# Patient Record
Sex: Female | Born: 1970 | Race: Black or African American | Hispanic: No | Marital: Married | Smoking: Current every day smoker
Health system: Southern US, Community
[De-identification: ages and names within clinical notes are randomized; demographics above are authoritative.]

## PROBLEM LIST (undated history)

## (undated) DIAGNOSIS — F32A Depression, unspecified: Secondary | ICD-10-CM

## (undated) DIAGNOSIS — IMO0002 Reserved for concepts with insufficient information to code with codable children: Secondary | ICD-10-CM

## (undated) DIAGNOSIS — Z8619 Personal history of other infectious and parasitic diseases: Secondary | ICD-10-CM

## (undated) DIAGNOSIS — M1990 Unspecified osteoarthritis, unspecified site: Secondary | ICD-10-CM

## (undated) DIAGNOSIS — T7840XA Allergy, unspecified, initial encounter: Secondary | ICD-10-CM

## (undated) DIAGNOSIS — F329 Major depressive disorder, single episode, unspecified: Secondary | ICD-10-CM

## (undated) DIAGNOSIS — Z5189 Encounter for other specified aftercare: Secondary | ICD-10-CM

## (undated) DIAGNOSIS — D649 Anemia, unspecified: Secondary | ICD-10-CM

## (undated) DIAGNOSIS — I10 Essential (primary) hypertension: Secondary | ICD-10-CM

## (undated) HISTORY — DX: Major depressive disorder, single episode, unspecified: F32.9

## (undated) HISTORY — DX: Unspecified osteoarthritis, unspecified site: M19.90

## (undated) HISTORY — DX: Anemia, unspecified: D64.9

## (undated) HISTORY — DX: Personal history of other infectious and parasitic diseases: Z86.19

## (undated) HISTORY — DX: Reserved for concepts with insufficient information to code with codable children: IMO0002

## (undated) HISTORY — DX: Encounter for other specified aftercare: Z51.89

## (undated) HISTORY — PX: TONSILLECTOMY: SUR1361

## (undated) HISTORY — DX: Depression, unspecified: F32.A

## (undated) HISTORY — DX: Allergy, unspecified, initial encounter: T78.40XA

## (undated) HISTORY — DX: Essential (primary) hypertension: I10

## (undated) HISTORY — PX: WISDOM TOOTH EXTRACTION: SHX21

---

## 2006-02-02 ENCOUNTER — Other Ambulatory Visit: Admission: RE | Admit: 2006-02-02 | Discharge: 2006-02-02 | Payer: Self-pay | Admitting: Obstetrics and Gynecology

## 2006-08-02 ENCOUNTER — Ambulatory Visit: Payer: Self-pay | Admitting: Family Medicine

## 2006-08-02 LAB — CONVERTED CEMR LAB
Chloride: 107 meq/L (ref 96–112)
Chol/HDL Ratio, serum: 3.3
Creatinine, Ser: 0.8 mg/dL (ref 0.4–1.2)
Eosinophil percent: 1 % (ref 0.0–5.0)
GFR calc non Af Amer: 87 mL/min
Glucose, Bld: 75 mg/dL (ref 70–99)
HCT: 33 % — ABNORMAL LOW (ref 36.0–46.0)
HDL: 46.8 mg/dL (ref >39.0)
LDL Cholesterol: 92 mg/dL (ref 0–99)
MCHC: 32 g/dL (ref 30.0–36.0)
MCV: 72.7 fL — ABNORMAL LOW (ref 78.0–100.0)
Monocytes Absolute: 0.1 10*3/uL — ABNORMAL LOW (ref 0.2–0.7)
Neutro Abs: 4 10*3/uL (ref 1.4–7.7)
Platelets: 343 10*3/uL (ref 150–400)
RBC: 4.54 M/uL (ref 3.87–5.11)
Sodium: 137 meq/L (ref 135–145)
Triglyceride fasting, serum: 72 mg/dL (ref 0–149)
VLDL: 14 mg/dL (ref 0–40)

## 2006-08-23 ENCOUNTER — Ambulatory Visit: Payer: Self-pay | Admitting: Family Medicine

## 2006-08-23 ENCOUNTER — Encounter: Admission: RE | Admit: 2006-08-23 | Discharge: 2006-08-23 | Payer: Self-pay | Admitting: Family Medicine

## 2006-08-24 ENCOUNTER — Ambulatory Visit: Payer: Self-pay | Admitting: Family Medicine

## 2006-10-19 DIAGNOSIS — IMO0001 Reserved for inherently not codable concepts without codable children: Secondary | ICD-10-CM

## 2006-10-19 DIAGNOSIS — Z5189 Encounter for other specified aftercare: Secondary | ICD-10-CM

## 2006-10-19 HISTORY — PX: DILATION AND CURETTAGE OF UTERUS: SHX78

## 2006-10-19 HISTORY — DX: Encounter for other specified aftercare: Z51.89

## 2006-10-19 HISTORY — DX: Reserved for inherently not codable concepts without codable children: IMO0001

## 2007-02-17 ENCOUNTER — Encounter: Admission: RE | Admit: 2007-02-17 | Discharge: 2007-02-17 | Payer: Self-pay | Admitting: Obstetrics and Gynecology

## 2007-07-28 ENCOUNTER — Observation Stay (HOSPITAL_COMMUNITY): Admission: AD | Admit: 2007-07-28 | Discharge: 2007-07-28 | Payer: Self-pay | Admitting: Obstetrics and Gynecology

## 2007-09-13 ENCOUNTER — Encounter (INDEPENDENT_AMBULATORY_CARE_PROVIDER_SITE_OTHER): Payer: Self-pay | Admitting: Obstetrics and Gynecology

## 2007-09-13 ENCOUNTER — Ambulatory Visit (HOSPITAL_COMMUNITY): Admission: RE | Admit: 2007-09-13 | Discharge: 2007-09-13 | Payer: Self-pay | Admitting: Obstetrics and Gynecology

## 2010-01-06 ENCOUNTER — Ambulatory Visit (HOSPITAL_COMMUNITY): Admission: RE | Admit: 2010-01-06 | Discharge: 2010-01-06 | Payer: Self-pay | Admitting: Gastroenterology

## 2010-03-04 ENCOUNTER — Ambulatory Visit (HOSPITAL_COMMUNITY): Admission: RE | Admit: 2010-03-04 | Discharge: 2010-03-04 | Payer: Self-pay | Admitting: General Surgery

## 2010-03-18 ENCOUNTER — Ambulatory Visit (HOSPITAL_COMMUNITY): Admission: RE | Admit: 2010-03-18 | Discharge: 2010-03-18 | Payer: Self-pay | Admitting: General Surgery

## 2010-11-09 ENCOUNTER — Encounter: Payer: Self-pay | Admitting: General Surgery

## 2010-11-09 ENCOUNTER — Encounter: Payer: Self-pay | Admitting: Gastroenterology

## 2011-01-05 LAB — CBC
Hemoglobin: 12.1 g/dL (ref 12.0–15.0)
MCHC: 32.8 g/dL (ref 30.0–36.0)
RDW: 15.6 % — ABNORMAL HIGH (ref 11.5–15.5)

## 2011-01-05 LAB — COMPREHENSIVE METABOLIC PANEL
BUN: 9 mg/dL (ref 6–23)
Calcium: 9.2 mg/dL (ref 8.4–10.5)
Creatinine, Ser: 0.87 mg/dL (ref 0.4–1.2)
Glucose, Bld: 82 mg/dL (ref 70–99)
Sodium: 138 mEq/L (ref 135–145)
Total Protein: 7.3 g/dL (ref 6.0–8.3)

## 2011-01-05 LAB — DIFFERENTIAL
Lymphocytes Relative: 35 % (ref 12–46)
Lymphs Abs: 2 10*3/uL (ref 0.7–4.0)
Monocytes Relative: 6 % (ref 3–12)
Neutro Abs: 3.2 10*3/uL (ref 1.7–7.7)
Neutrophils Relative %: 57 % (ref 43–77)

## 2011-03-03 NOTE — Op Note (Signed)
Lauren Walls, Lauren Walls           ACCOUNT NO.:  [id number]   MEDICAL RECORD NO.:  [id number]          PATIENT TYPE:  AMB   LOCATION:  SDC                           FACILITY:  [facility]   PHYSICIAN:  Osborn Coho, M.D.   DATE OF BIRTH:  04-17-1971   DATE OF PROCEDURE:  09/13/2007  DATE OF DISCHARGE:                               OPERATIVE REPORT   PREOPERATIVE DIAGNOSIS:  Dysfunctional uterine bleeding.   POSTOPERATIVE DIAGNOSIS:  Dysfunctional uterine bleeding.   PROCEDURES:  1. Hysteroscopy.  2. Dilation and curettage.   ATTENDING SURGEON:  Osborn Coho, M.D.   ANESTHESIA:  General via LMA.   FINDINGS:  No intracavitary lesions.   SPECIMENS TO PATHOLOGY:  Endometrial curettings.   FLUIDS:  1000 mL   URINE OUTPUT:  Quantity sufficient via straight catheterization prior to  procedure.   ESTIMATED BLOOD LOSS:  Minimal.   COMPLICATIONS:  None.   PROCEDURE:  The patient was taken to the operating room after the risks,  benefits, and alternatives were reviewed with the patient.  The patient  verbalized understanding, and consent signed and witnessed.  The patient  was placed under general anesthesia and prepped and draped in a normal  sterile fashion in the dorsal lithotomy position.  A bivalve speculum  was placed in the patient's vagina, and a paracervical block  administered using a total of 10 mL of 1% lidocaine.  The anterior lip  of the cervix was grasped with a single-tooth tenaculum, and the cervix  was dilated for passage of the hysteroscope.  The uterus was sounded to  8.5 cm, and hysteroscope was introduced, and no intracavitary lesions  were noted.  Curettage was performed and a gritty texture noted.  Hysteroscope was then  introduced once again, and findings were the same.  All instruments were  removed.  There was good hemostasis at the tenaculum site.  Count was  correct.  The patient tolerated the procedure well and was awaiting  transfer to the recovery  room in good condition.      Osborn Coho, M.D.  Electronically Signed     AR/MEDQ  D:  09/13/2007  T:  09/13/2007  Job:  [id number]

## 2011-03-06 NOTE — Assessment & Plan Note (Signed)
[facility]                          [place] OFFICE NOTE   Lauren Walls, Lauren Walls                    MRN:          [id number]  DATE:08/02/2006                            DOB:          12-Oct-1971    The patient is a 40 year old African American female who was sent to our  office by Dr. Su Hilt for high blood pressure. The patient also has a  question as to whether she has exercise-induced asthma or not. The patient  states that she coughs and wheezes with exercise and when it is hot and  humid outside. Otherwise, she has no problems. She has never had an inhaler  or been told she has asthma. She denies any headache or chest pain. Dr.  Su Hilt sent her secondary to her blood pressure being so high in the  office. She denies also any shortness of breath. The patient admits to being  told previously that she had high blood pressure and was on Lotrel in the  past, but was taken off of it because her blood pressure with diet and  exercise stabilized.   PAST MEDICAL HISTORY:  PCOS and hypertension.   FAMILY HISTORY:  Her mother and father both have hypertension as well as her  brother. Her father also has cancer of the prostate and diabetes and has a  brother who also has exercise-induced asthma.   PAST SURGICAL HISTORY:  Denies any surgery.   SOCIAL HISTORY:  She quit smoking in July of 2007. At that time she was  smoking three cigarettes a day for seven years. She drinks alcohol  occasionally and denies any drugs. She is single and not currently sexually  active and has no children. The patient has never been pregnant.   Last Pap smear was in June 2007, which was negative. A tetanus shot was in  1999.   CURRENT MEDICATIONS:  She is taking metformin three a day. She did not know  the dose. Was recently on Yaz for birth control, but Dr. Su Hilt stopped  that secondary to her blood pressure.   ALLERGIES:  The patient has no known drug  allergies.   PHYSICAL EXAMINATION:  VITAL SIGNS:  She is 5 feet 8 inches. Weight: 371  pounds. Temperature: 98.8. Pulse: 76. Respiratory rate: 20. Blood pressure:  138/98; a repeat done a few minutes later was 140/98.  GENERAL:  She is awake, alert and oriented in no acute distress.  HEENT: Head is normocephalic, atraumatic. Eyes: Pupils are equal, reactive  to light. Tympanic membranes are intact bilaterally. Oropharynx is clear.  NECK:  Supple. No JVD. No bruits.  HEART: Positive S1, S2. No murmurs appreciated.  LUNGS:  Clear bilaterally. No rales, rhonchi or wheezing.  ABDOMEN: Soft and nontender, but obese. No masses palpated.  GYN/BREASTS: Done by the gynecologist.  MUSCULOSKELETAL: She has good strength in all four extremities. No sensory  or motor deficits.  SKIN: No rashes. The patient does point out a mole on her right palm, on the  right ulnar side of her palm. It is flat without irregular borders and  hyperpigmented.  NEURO: Deep tendon reflexes  are equal and bilateral.   EKG was done which showed normal sinus rhythm at a rate of 83 beats per  minute.   ASSESSMENT/PLAN:  1. Complete physical examination: Will check fasting labs. Her GYN      examination was already done by Dr. Su Hilt. General health maintenance      was up-to-date.  2. Hypertension: Will start her back on Lotrel 10/5 one a day and will      recheck her blood pressure in 2-3 weeks.  3. Possible exercise-induced asthma: I did give the patient a Ventolin      inhaler and told her to use two puffs a half hour before exercise and      we will follow up again with her in 2-3 weeks. She is aware that if she      is using the Ventolin frequently that we would need to put her on a      more long-acting agent.  4. Palmar mole: Will check RPR, although I doubt this will be positive      since she has had it for years. Will refer to Dermatology for      evaluation.       Lauren Perla, DO       YRL/MedQ  DD:  08/03/2006  DT:  08/03/2006  Job #:  [id number]

## 2011-05-20 DIAGNOSIS — IMO0002 Reserved for concepts with insufficient information to code with codable children: Secondary | ICD-10-CM

## 2011-05-20 DIAGNOSIS — R87619 Unspecified abnormal cytological findings in specimens from cervix uteri: Secondary | ICD-10-CM

## 2011-05-20 HISTORY — DX: Reserved for concepts with insufficient information to code with codable children: IMO0002

## 2011-05-20 HISTORY — DX: Unspecified abnormal cytological findings in specimens from cervix uteri: R87.619

## 2011-07-28 LAB — CBC
Hemoglobin: 10.9 — ABNORMAL LOW
MCHC: 32.8
MCV: 72.3 — ABNORMAL LOW
RBC: 4.6
WBC: 6.7

## 2011-07-28 LAB — BASIC METABOLIC PANEL
CO2: 27
Chloride: 99
GFR calc Af Amer: >60
Sodium: 136

## 2011-07-28 LAB — POTASSIUM: Potassium: 3.5

## 2011-07-30 LAB — CROSSMATCH: Antibody Screen: NEGATIVE

## 2011-07-30 LAB — HEMOGLOBIN AND HEMATOCRIT, BLOOD
HCT: 26.3 — ABNORMAL LOW
Hemoglobin: 8.3 — ABNORMAL LOW

## 2011-08-17 LAB — OB RESULTS CONSOLE RUBELLA ANTIBODY, IGM: Rubella: IMMUNE

## 2011-08-17 LAB — OB RESULTS CONSOLE ABO/RH: RH Type: NEGATIVE

## 2011-08-17 LAB — OB RESULTS CONSOLE GC/CHLAMYDIA: Chlamydia: NEGATIVE

## 2011-08-17 LAB — OB RESULTS CONSOLE HEPATITIS B SURFACE ANTIGEN: Hepatitis B Surface Ag: NEGATIVE

## 2011-09-18 DIAGNOSIS — Z6791 Unspecified blood type, Rh negative: Secondary | ICD-10-CM | POA: Insufficient documentation

## 2011-09-18 DIAGNOSIS — IMO0002 Reserved for concepts with insufficient information to code with codable children: Secondary | ICD-10-CM | POA: Insufficient documentation

## 2011-09-18 DIAGNOSIS — R638 Other symptoms and signs concerning food and fluid intake: Secondary | ICD-10-CM | POA: Insufficient documentation

## 2011-09-18 DIAGNOSIS — F418 Other specified anxiety disorders: Secondary | ICD-10-CM | POA: Insufficient documentation

## 2011-09-18 DIAGNOSIS — Z87898 Personal history of other specified conditions: Secondary | ICD-10-CM | POA: Insufficient documentation

## 2011-09-18 DIAGNOSIS — O09529 Supervision of elderly multigravida, unspecified trimester: Secondary | ICD-10-CM | POA: Insufficient documentation

## 2012-01-06 ENCOUNTER — Other Ambulatory Visit: Payer: Self-pay | Admitting: Obstetrics and Gynecology

## 2012-01-06 DIAGNOSIS — Z3689 Encounter for other specified antenatal screening: Secondary | ICD-10-CM

## 2012-01-07 ENCOUNTER — Other Ambulatory Visit: Payer: 59

## 2012-01-07 ENCOUNTER — Encounter (INDEPENDENT_AMBULATORY_CARE_PROVIDER_SITE_OTHER): Payer: 59 | Admitting: Obstetrics and Gynecology

## 2012-01-07 ENCOUNTER — Other Ambulatory Visit: Payer: Self-pay

## 2012-01-07 ENCOUNTER — Ambulatory Visit (HOSPITAL_COMMUNITY)
Admission: RE | Admit: 2012-01-07 | Discharge: 2012-01-07 | Disposition: A | Discharge status: Home / Self Care | Payer: 59 | Source: Ambulatory Visit | Attending: Obstetrics and Gynecology | Admitting: Obstetrics and Gynecology

## 2012-01-07 DIAGNOSIS — O10019 Pre-existing essential hypertension complicating pregnancy, unspecified trimester: Secondary | ICD-10-CM | POA: Insufficient documentation

## 2012-01-07 DIAGNOSIS — Z3480 Encounter for supervision of other normal pregnancy, unspecified trimester: Secondary | ICD-10-CM

## 2012-01-07 DIAGNOSIS — Z3689 Encounter for other specified antenatal screening: Secondary | ICD-10-CM | POA: Insufficient documentation

## 2012-01-19 ENCOUNTER — Other Ambulatory Visit: Payer: Self-pay

## 2012-01-19 DIAGNOSIS — O365990 Maternal care for other known or suspected poor fetal growth, unspecified trimester, not applicable or unspecified: Secondary | ICD-10-CM

## 2012-01-22 ENCOUNTER — Encounter (INDEPENDENT_AMBULATORY_CARE_PROVIDER_SITE_OTHER): Payer: 59 | Admitting: Obstetrics and Gynecology

## 2012-01-22 ENCOUNTER — Encounter: Payer: 59 | Admitting: Obstetrics and Gynecology

## 2012-01-22 ENCOUNTER — Inpatient Hospital Stay (HOSPITAL_COMMUNITY)
Admission: AD | Admit: 2012-01-22 | Discharge: 2012-01-22 | Disposition: A | Discharge status: Home / Self Care | Payer: 59 | Source: Ambulatory Visit | Attending: Obstetrics and Gynecology | Admitting: Obstetrics and Gynecology

## 2012-01-22 DIAGNOSIS — Z298 Encounter for other specified prophylactic measures: Secondary | ICD-10-CM | POA: Insufficient documentation

## 2012-01-22 DIAGNOSIS — Z331 Pregnant state, incidental: Secondary | ICD-10-CM

## 2012-01-22 DIAGNOSIS — Z2989 Encounter for other specified prophylactic measures: Secondary | ICD-10-CM | POA: Insufficient documentation

## 2012-01-22 DIAGNOSIS — R319 Hematuria, unspecified: Secondary | ICD-10-CM

## 2012-01-22 MED ORDER — RHO D IMMUNE GLOBULIN 1500 UNIT/2ML IJ SOLN
300.0000 ug | Freq: Once | INTRAMUSCULAR | Status: AC
  Administered 2012-01-22: 300 ug via INTRAMUSCULAR
  Filled 2012-01-22: qty 2

## 2012-01-22 MED ORDER — TETANUS-DIPHTH-ACELL PERTUSSIS 5-2.5-18.5 LF-MCG/0.5 IM SUSP
0.5000 mL | Freq: Once | INTRAMUSCULAR | Status: AC
  Administered 2012-01-22: 0.5 mL via INTRAMUSCULAR
  Filled 2012-01-22: qty 0.5

## 2012-01-22 NOTE — Plan of Care (Signed)
Rhophylac information sheet given to the patient while waiting in the lobby. Patient states she has an appointment at 1430 and will leave and return. Instructed not to remove the bracelet. Explained the 1 1/2 hours required to process this order.

## 2012-01-23 LAB — RH IG WORKUP (INCLUDES ABO/RH)
ABO/RH(D): A NEG
Antibody Screen: NEGATIVE

## 2012-02-04 ENCOUNTER — Other Ambulatory Visit: Payer: Self-pay | Admitting: Obstetrics and Gynecology

## 2012-02-04 ENCOUNTER — Encounter: Payer: Self-pay | Admitting: Obstetrics and Gynecology

## 2012-02-04 ENCOUNTER — Ambulatory Visit (INDEPENDENT_AMBULATORY_CARE_PROVIDER_SITE_OTHER): Payer: 59

## 2012-02-04 ENCOUNTER — Ambulatory Visit (INDEPENDENT_AMBULATORY_CARE_PROVIDER_SITE_OTHER): Payer: 59 | Admitting: Obstetrics and Gynecology

## 2012-02-04 ENCOUNTER — Other Ambulatory Visit: Payer: 59

## 2012-02-04 VITALS — BP 108/66 | Ht 68.0 in | Wt 180.0 lb

## 2012-02-04 DIAGNOSIS — O365990 Maternal care for other known or suspected poor fetal growth, unspecified trimester, not applicable or unspecified: Secondary | ICD-10-CM

## 2012-02-04 DIAGNOSIS — I10 Essential (primary) hypertension: Secondary | ICD-10-CM

## 2012-02-04 DIAGNOSIS — N870 Mild cervical dysplasia: Secondary | ICD-10-CM

## 2012-02-04 DIAGNOSIS — Z331 Pregnant state, incidental: Secondary | ICD-10-CM

## 2012-02-04 LAB — US OB FOLLOW UP

## 2012-02-04 NOTE — Progress Notes (Signed)
U/S EFW 3lbs 13oz, cvx 3.7cm, AFI 17cm, post placenta No complaints except tired FKCs RTO 2wks for BPP Then BPP qwk EFW with BPP in 4wks Repeat pap at [facility]

## 2012-02-12 ENCOUNTER — Ambulatory Visit (INDEPENDENT_AMBULATORY_CARE_PROVIDER_SITE_OTHER): Payer: 59 | Admitting: Obstetrics and Gynecology

## 2012-02-12 ENCOUNTER — Encounter: Payer: Self-pay | Admitting: Obstetrics and Gynecology

## 2012-02-12 DIAGNOSIS — N764 Abscess of vulva: Secondary | ICD-10-CM

## 2012-02-12 DIAGNOSIS — R319 Hematuria, unspecified: Secondary | ICD-10-CM

## 2012-02-12 DIAGNOSIS — R809 Proteinuria, unspecified: Secondary | ICD-10-CM

## 2012-02-12 DIAGNOSIS — N870 Mild cervical dysplasia: Secondary | ICD-10-CM

## 2012-02-12 LAB — POCT URINALYSIS DIPSTICK
Nitrite, UA: NEGATIVE
Urobilinogen, UA: 1
pH, UA: 7.5

## 2012-02-12 MED ORDER — CEPHALEXIN 500 MG PO CAPS: 500.0000 mg | ORAL_CAPSULE | Freq: Two times a day (BID) | ORAL | Status: AC

## 2012-02-12 NOTE — Progress Notes (Signed)
C/o bleeding from cyst on labia  Filed Vitals:   02/12/12 1009  BP: 122/84   Ext genitalia with rt labial cyst about 1cm.  Small area of drainage with tenderness. 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

## 2012-02-14 LAB — CULTURE, OB URINE: Colony Count: 5000

## 2012-02-15 ENCOUNTER — Other Ambulatory Visit: Payer: Self-pay | Admitting: Obstetrics and Gynecology

## 2012-02-15 DIAGNOSIS — O365990 Maternal care for other known or suspected poor fetal growth, unspecified trimester, not applicable or unspecified: Secondary | ICD-10-CM

## 2012-02-15 LAB — HSV 2 ANTIBODY, IGG: HSV 2 Glycoprotein G Ab, IgG: <0.1 IV

## 2012-02-15 LAB — HSV 1 ANTIBODY, IGG: HSV 1 Glycoprotein G Ab, IgG: <0.1 IV

## 2012-02-16 ENCOUNTER — Encounter: Payer: Self-pay | Admitting: Obstetrics and Gynecology

## 2012-02-16 LAB — PAP IG W/ RFLX HPV ASCU

## 2012-02-17 ENCOUNTER — Encounter: Payer: Self-pay | Admitting: Obstetrics and Gynecology

## 2012-02-18 ENCOUNTER — Other Ambulatory Visit: Payer: 59

## 2012-02-18 ENCOUNTER — Ambulatory Visit (INDEPENDENT_AMBULATORY_CARE_PROVIDER_SITE_OTHER): Payer: 59

## 2012-02-18 ENCOUNTER — Encounter: Payer: Self-pay | Admitting: Obstetrics and Gynecology

## 2012-02-18 ENCOUNTER — Other Ambulatory Visit: Payer: Self-pay | Admitting: Obstetrics and Gynecology

## 2012-02-18 ENCOUNTER — Ambulatory Visit (INDEPENDENT_AMBULATORY_CARE_PROVIDER_SITE_OTHER): Payer: 59 | Admitting: Obstetrics and Gynecology

## 2012-02-18 VITALS — BP 114/68 | Wt 280.0 lb

## 2012-02-18 DIAGNOSIS — O365990 Maternal care for other known or suspected poor fetal growth, unspecified trimester, not applicable or unspecified: Secondary | ICD-10-CM

## 2012-02-18 DIAGNOSIS — O0990 Supervision of high risk pregnancy, unspecified, unspecified trimester: Secondary | ICD-10-CM

## 2012-02-18 DIAGNOSIS — R809 Proteinuria, unspecified: Secondary | ICD-10-CM

## 2012-02-18 DIAGNOSIS — O10919 Unspecified pre-existing hypertension complicating pregnancy, unspecified trimester: Secondary | ICD-10-CM

## 2012-02-18 DIAGNOSIS — O10019 Pre-existing essential hypertension complicating pregnancy, unspecified trimester: Secondary | ICD-10-CM

## 2012-02-18 LAB — CREATININE CLEARANCE, URINE, 24 HOUR: Creatinine, Urine: 108.3 mg/dL

## 2012-02-18 NOTE — Progress Notes (Signed)
No complaints Area on rt labia with decreased swelling and NT U/S today with BPP 8/8 and normal fluid and vertex FKCs RTO 1wk for BPP and in 2wks for EFW and BPP F/u 24hr urine handed in today - GHTN precautions reviewed LGSIL on pap rec repeat with colpo PP

## 2012-02-19 ENCOUNTER — Telehealth: Payer: Self-pay

## 2012-02-24 DIAGNOSIS — IMO0002 Reserved for concepts with insufficient information to code with codable children: Secondary | ICD-10-CM

## 2012-02-24 DIAGNOSIS — Z6791 Unspecified blood type, Rh negative: Secondary | ICD-10-CM

## 2012-02-24 DIAGNOSIS — F418 Other specified anxiety disorders: Secondary | ICD-10-CM

## 2012-02-24 DIAGNOSIS — R638 Other symptoms and signs concerning food and fluid intake: Secondary | ICD-10-CM

## 2012-02-24 DIAGNOSIS — Z87898 Personal history of other specified conditions: Secondary | ICD-10-CM

## 2012-02-24 DIAGNOSIS — O09529 Supervision of elderly multigravida, unspecified trimester: Secondary | ICD-10-CM

## 2012-02-25 ENCOUNTER — Ambulatory Visit (INDEPENDENT_AMBULATORY_CARE_PROVIDER_SITE_OTHER): Payer: 59 | Admitting: Obstetrics and Gynecology

## 2012-02-25 ENCOUNTER — Other Ambulatory Visit: Payer: 59

## 2012-02-25 ENCOUNTER — Other Ambulatory Visit: Payer: Self-pay | Admitting: Obstetrics and Gynecology

## 2012-02-25 ENCOUNTER — Ambulatory Visit (INDEPENDENT_AMBULATORY_CARE_PROVIDER_SITE_OTHER): Payer: 59

## 2012-02-25 VITALS — BP 104/64 | Wt 282.0 lb

## 2012-02-25 DIAGNOSIS — O10919 Unspecified pre-existing hypertension complicating pregnancy, unspecified trimester: Secondary | ICD-10-CM

## 2012-02-25 DIAGNOSIS — O10019 Pre-existing essential hypertension complicating pregnancy, unspecified trimester: Secondary | ICD-10-CM

## 2012-02-25 DIAGNOSIS — N870 Mild cervical dysplasia: Secondary | ICD-10-CM

## 2012-02-25 LAB — US FETAL BPP WO NON STRESS

## 2012-02-25 NOTE — Progress Notes (Signed)
No complaints except pressure U/S cvx 3.2cm, vtx, post placenta, nl fluid, BPP 8/8 EFW in 1wk with BPP Reviewed FKCs and PTL precautions

## 2012-03-03 ENCOUNTER — Other Ambulatory Visit: Payer: 59

## 2012-03-03 ENCOUNTER — Encounter: Payer: 59 | Admitting: Obstetrics and Gynecology

## 2012-03-03 ENCOUNTER — Other Ambulatory Visit: Payer: Self-pay | Admitting: Obstetrics and Gynecology

## 2012-03-03 ENCOUNTER — Ambulatory Visit (INDEPENDENT_AMBULATORY_CARE_PROVIDER_SITE_OTHER): Payer: 59

## 2012-03-03 ENCOUNTER — Ambulatory Visit (INDEPENDENT_AMBULATORY_CARE_PROVIDER_SITE_OTHER): Payer: 59 | Admitting: Obstetrics and Gynecology

## 2012-03-03 VITALS — BP 110/62 | Wt 282.0 lb

## 2012-03-03 DIAGNOSIS — O10919 Unspecified pre-existing hypertension complicating pregnancy, unspecified trimester: Secondary | ICD-10-CM

## 2012-03-03 DIAGNOSIS — I10 Essential (primary) hypertension: Secondary | ICD-10-CM

## 2012-03-03 DIAGNOSIS — O10019 Pre-existing essential hypertension complicating pregnancy, unspecified trimester: Secondary | ICD-10-CM

## 2012-03-03 DIAGNOSIS — O09529 Supervision of elderly multigravida, unspecified trimester: Secondary | ICD-10-CM

## 2012-03-03 LAB — US OB FOLLOW UP

## 2012-03-03 NOTE — Progress Notes (Signed)
No complaints except stressed at work would like to take leave Will request for pt to start leave now secondary to worsening stress at work U/S BPP 8/8, vtx, nl fluid, post placenta FKCs GBS at [facility] RTO 1wk for BPP and ROB

## 2012-03-04 ENCOUNTER — Other Ambulatory Visit: Payer: 59

## 2012-03-04 ENCOUNTER — Encounter: Payer: 59 | Admitting: Obstetrics and Gynecology

## 2012-03-10 ENCOUNTER — Other Ambulatory Visit: Payer: Self-pay | Admitting: Obstetrics and Gynecology

## 2012-03-10 ENCOUNTER — Ambulatory Visit (INDEPENDENT_AMBULATORY_CARE_PROVIDER_SITE_OTHER): Payer: 59 | Admitting: Obstetrics and Gynecology

## 2012-03-10 ENCOUNTER — Ambulatory Visit (INDEPENDENT_AMBULATORY_CARE_PROVIDER_SITE_OTHER): Payer: 59

## 2012-03-10 VITALS — BP 132/72 | Wt 286.0 lb

## 2012-03-10 DIAGNOSIS — Z331 Pregnant state, incidental: Secondary | ICD-10-CM

## 2012-03-10 DIAGNOSIS — O26899 Other specified pregnancy related conditions, unspecified trimester: Secondary | ICD-10-CM

## 2012-03-10 DIAGNOSIS — N949 Unspecified condition associated with female genital organs and menstrual cycle: Secondary | ICD-10-CM

## 2012-03-10 DIAGNOSIS — R809 Proteinuria, unspecified: Secondary | ICD-10-CM

## 2012-03-10 DIAGNOSIS — Z3685 Encounter for antenatal screening for Streptococcus B: Secondary | ICD-10-CM

## 2012-03-10 DIAGNOSIS — O169 Unspecified maternal hypertension, unspecified trimester: Secondary | ICD-10-CM

## 2012-03-10 DIAGNOSIS — O9989 Other specified diseases and conditions complicating pregnancy, childbirth and the puerperium: Secondary | ICD-10-CM

## 2012-03-10 LAB — POCT URINALYSIS DIPSTICK
Bilirubin, UA: NEGATIVE
Leukocytes, UA: NEGATIVE
Nitrite, UA: NEGATIVE
pH, UA: >=9

## 2012-03-10 NOTE — Progress Notes (Signed)
Pt denies HA, visual changes or abd pain Ultrasound today shows AFI to be 13.6 cm cervix measures 3.0 cm, vertex, posterior placenta, normal fluid, BPP 8/8 Group B strep today U/S 5/16 EFW 5lbs 5oz 41% Proteinuria with tr blood - urine to culture If urine cx neg - repeat 24hr urine If urine cx pos - treat and repeat urine dip Pt is stressed.  Mom is sick (being treated for mutiple myeloma) - and she lives in [place]. Pt is high risk secondary h/o CHTN, inc BMI and also has work and the above personal stressors. Gest Htn labs today RTO 1wk for BPP and AFI Precautions reviewed

## 2012-03-11 LAB — COMPREHENSIVE METABOLIC PANEL
CO2: 23 mEq/L (ref 19–32)
Creat: 0.75 mg/dL (ref 0.50–1.10)
Glucose, Bld: 63 mg/dL — ABNORMAL LOW (ref 70–99)
Total Bilirubin: 0.3 mg/dL (ref 0.3–1.2)

## 2012-03-11 LAB — CBC
HCT: 32 % — ABNORMAL LOW (ref 36.0–46.0)
MCV: 74.8 fL — ABNORMAL LOW (ref 78.0–100.0)
RBC: 4.28 MIL/uL (ref 3.87–5.11)
WBC: 6.9 10*3/uL (ref 4.0–10.5)

## 2012-03-12 LAB — STREP B DNA PROBE: GBSP: NEGATIVE

## 2012-03-14 ENCOUNTER — Other Ambulatory Visit: Payer: Self-pay | Admitting: Obstetrics and Gynecology

## 2012-03-14 NOTE — Progress Notes (Signed)
Addended by: Noni Saupe on: 03/14/2012 01:10 PM   Modules accepted: Orders

## 2012-03-15 ENCOUNTER — Ambulatory Visit (INDEPENDENT_AMBULATORY_CARE_PROVIDER_SITE_OTHER): Payer: 59 | Admitting: Obstetrics and Gynecology

## 2012-03-15 ENCOUNTER — Ambulatory Visit: Payer: 59 | Admitting: Obstetrics and Gynecology

## 2012-03-15 ENCOUNTER — Other Ambulatory Visit: Payer: Self-pay | Admitting: Obstetrics and Gynecology

## 2012-03-15 ENCOUNTER — Ambulatory Visit (INDEPENDENT_AMBULATORY_CARE_PROVIDER_SITE_OTHER): Payer: 59

## 2012-03-15 ENCOUNTER — Other Ambulatory Visit (INDEPENDENT_AMBULATORY_CARE_PROVIDER_SITE_OTHER): Payer: 59

## 2012-03-15 VITALS — BP 122/70 | Wt 290.5 lb

## 2012-03-15 VITALS — BP 130/82 | Wt 290.5 lb

## 2012-03-15 DIAGNOSIS — O368190 Decreased fetal movements, unspecified trimester, not applicable or unspecified: Secondary | ICD-10-CM

## 2012-03-15 DIAGNOSIS — IMO0002 Reserved for concepts with insufficient information to code with codable children: Secondary | ICD-10-CM

## 2012-03-15 DIAGNOSIS — Z331 Pregnant state, incidental: Secondary | ICD-10-CM

## 2012-03-15 DIAGNOSIS — O139 Gestational [pregnancy-induced] hypertension without significant proteinuria, unspecified trimester: Secondary | ICD-10-CM

## 2012-03-15 DIAGNOSIS — R809 Proteinuria, unspecified: Secondary | ICD-10-CM

## 2012-03-15 DIAGNOSIS — R51 Headache: Secondary | ICD-10-CM

## 2012-03-15 LAB — POCT URINALYSIS DIPSTICK
Bilirubin, UA: NEGATIVE
Glucose, UA: NEGATIVE
Ketones, UA: NEGATIVE
Spec Grav, UA: <=1.005

## 2012-03-15 LAB — URIC ACID: Uric Acid, Serum: 4.8 mg/dL (ref 2.4–7.0)

## 2012-03-15 LAB — COMPREHENSIVE METABOLIC PANEL
ALT: 7 U/L (ref 0–35)
AST: 11 U/L (ref 0–37)
Albumin: 2.4 g/dL — ABNORMAL LOW (ref 3.5–5.2)
CO2: 23 mEq/L (ref 19–32)
Calcium: 8.7 mg/dL (ref 8.4–10.5)
Chloride: 104 mEq/L (ref 96–112)
Creat: 0.68 mg/dL (ref 0.50–1.10)
Potassium: 3.9 mEq/L (ref 3.5–5.3)
Sodium: 140 mEq/L (ref 135–145)
Total Protein: 5.6 g/dL — ABNORMAL LOW (ref 6.0–8.3)

## 2012-03-15 LAB — LACTATE DEHYDROGENASE: LDH: 154 U/L (ref 94–250)

## 2012-03-15 LAB — CBC
Hemoglobin: 9.9 g/dL — ABNORMAL LOW (ref 12.0–15.0)
MCH: 23.8 pg — ABNORMAL LOW (ref 26.0–34.0)
MCHC: 31.6 g/dL (ref 30.0–36.0)
MCV: 75.2 fL — ABNORMAL LOW (ref 78.0–100.0)

## 2012-03-15 LAB — US FETAL BPP WO NON STRESS

## 2012-03-15 LAB — PROTEIN, URINE, RANDOM: Total Protein, Urine: 11 mg/dL

## 2012-03-15 LAB — CREATININE, URINE, RANDOM: Creatinine, Urine: 125.2 mg/dL

## 2012-03-15 LAB — CREATININE, URINE, 24 HOUR: Creatinine, 24H Ur: 2692 mg/d — ABNORMAL HIGH (ref 700–1800)

## 2012-03-15 LAB — PROTEIN, URINE, 24 HOUR: Protein, Urine: 11 mg/dL

## 2012-03-15 NOTE — Progress Notes (Signed)
C/O headache.  No blurred vision or right upper quadrant tenderness. 24-hour urine protein is 237 mg per 24 hours.  Liver enzymes normal.  Platelet count normal. Abdomen nontender Reflexes normal.  No clonus. Nonstress test: Not reactive.  Accelerations noted but not 15 bpm for 15 seconds.  Ultrasound: Single gestation, normal fluid, vertex, biophysical profile 8/8. Preeclampsia discussed with the patient and her husband.  We will check her blood pressure in 3 days and repeat her nonstress test.  We will collect a 24-hour urine sample for protein in one week.  The patient was told to call if her symptoms worsen.  They understand that if this is preeclampsia, then delivery is the only cure.

## 2012-03-16 ENCOUNTER — Encounter: Payer: 59 | Admitting: Obstetrics and Gynecology

## 2012-03-16 ENCOUNTER — Other Ambulatory Visit: Payer: 59

## 2012-03-17 ENCOUNTER — Encounter: Payer: 59 | Admitting: Obstetrics and Gynecology

## 2012-03-17 ENCOUNTER — Other Ambulatory Visit: Payer: 59

## 2012-03-18 ENCOUNTER — Ambulatory Visit (INDEPENDENT_AMBULATORY_CARE_PROVIDER_SITE_OTHER): Payer: 59

## 2012-03-18 ENCOUNTER — Other Ambulatory Visit: Payer: Self-pay

## 2012-03-18 ENCOUNTER — Ambulatory Visit (INDEPENDENT_AMBULATORY_CARE_PROVIDER_SITE_OTHER): Payer: 59 | Admitting: Obstetrics and Gynecology

## 2012-03-18 ENCOUNTER — Encounter: Payer: Self-pay | Admitting: Obstetrics and Gynecology

## 2012-03-18 ENCOUNTER — Telehealth: Payer: Self-pay

## 2012-03-18 VITALS — BP 110/68 | Wt 290.0 lb

## 2012-03-18 DIAGNOSIS — O289 Unspecified abnormal findings on antenatal screening of mother: Secondary | ICD-10-CM

## 2012-03-18 DIAGNOSIS — O169 Unspecified maternal hypertension, unspecified trimester: Secondary | ICD-10-CM

## 2012-03-18 DIAGNOSIS — O288 Other abnormal findings on antenatal screening of mother: Secondary | ICD-10-CM

## 2012-03-18 DIAGNOSIS — O139 Gestational [pregnancy-induced] hypertension without significant proteinuria, unspecified trimester: Secondary | ICD-10-CM

## 2012-03-18 NOTE — Progress Notes (Signed)
Pt without c/o No headaches or blurred vision or ruq pain Physical Examination: General appearance - alert, well appearing, and in no distress Chest - clear to auscultation, no wheezes, rales or rhonchi, symmetric air entry Heart - normal rate and regular rhythm Abdomen - soft, nontender, nondistended, no masses or organomegaly Extremities -  no clubbing or cyanosis, pedal edema 1 plus + Bpp8/8 AFI 16.9 Continue fkc F/u as scheduled

## 2012-03-18 NOTE — Patient Instructions (Signed)
Fetal Movement Counts Patient Name: __________________________________________________ Patient Due Date: ____________________ Kick counts is highly recommended in high risk pregnancies, but it is a good idea for every pregnant woman to do. Start counting fetal movements at 28 weeks of the pregnancy. Fetal movements increase after eating a full meal or eating or drinking something sweet (the blood sugar is higher). It is also important to drink plenty of fluids (well hydrated) before doing the count. Lie on your left side because it helps with the circulation or you can sit in a comfortable chair with your arms over your belly (abdomen) with no distractions around you. DOING THE COUNT  Try to do the count the same time of day each time you do it.   Mark the day and time, then see how long it takes for you to feel 10 movements (kicks, flutters, swishes, rolls). You should have at least 10 movements within 2 hours. You will most likely feel 10 movements in much less than 2 hours. If you do not, wait an hour and count again. After a couple of days you will see a pattern.   What you are looking for is a change in the pattern or not enough counts in 2 hours. Is it taking longer in time to reach 10 movements?  SEEK MEDICAL CARE IF:  You feel less than 10 counts in 2 hours. Tried twice.   No movement in one hour.   The pattern is changing or taking longer each day to reach 10 counts in 2 hours.   You feel the baby is not moving as it usually does.  Date: ____________ Movements: ____________ Start time: ____________ Finish time: ____________  Date: ____________ Movements: ____________ Start time: ____________ Finish time: ____________ Date: ____________ Movements: ____________ Start time: ____________ Finish time: ____________ Date: ____________ Movements: ____________ Start time: ____________ Finish time: ____________ Date: ____________ Movements: ____________ Start time: ____________ Finish time:  ____________ Date: ____________ Movements: ____________ Start time: ____________ Finish time: ____________ Date: ____________ Movements: ____________ Start time: ____________ Finish time: ____________ Date: ____________ Movements: ____________ Start time: ____________ Finish time: ____________  Date: ____________ Movements: ____________ Start time: ____________ Finish time: ____________ Date: ____________ Movements: ____________ Start time: ____________ Finish time: ____________ Date: ____________ Movements: ____________ Start time: ____________ Finish time: ____________ Date: ____________ Movements: ____________ Start time: ____________ Finish time: ____________ Date: ____________ Movements: ____________ Start time: ____________ Finish time: ____________ Date: ____________ Movements: ____________ Start time: ____________ Finish time: ____________ Date: ____________ Movements: ____________ Start time: ____________ Finish time: ____________  Date: ____________ Movements: ____________ Start time: ____________ Finish time: ____________ Date: ____________ Movements: ____________ Start time: ____________ Finish time: ____________ Date: ____________ Movements: ____________ Start time: ____________ Finish time: ____________ Date: ____________ Movements: ____________ Start time: ____________ Finish time: ____________ Date: ____________ Movements: ____________ Start time: ____________ Finish time: ____________ Date: ____________ Movements: ____________ Start time: ____________ Finish time: ____________ Date: ____________ Movements: ____________ Start time: ____________ Finish time: ____________  Date: ____________ Movements: ____________ Start time: ____________ Finish time: ____________ Date: ____________ Movements: ____________ Start time: ____________ Finish time: ____________ Date: ____________ Movements: ____________ Start time: ____________ Finish time: ____________ Date: ____________ Movements:  ____________ Start time: ____________ Finish time: ____________ Date: ____________ Movements: ____________ Start time: ____________ Finish time: ____________ Date: ____________ Movements: ____________ Start time: ____________ Finish time: ____________ Date: ____________ Movements: ____________ Start time: ____________ Finish time: ____________  Date: ____________ Movements: ____________ Start time: ____________ Finish time: ____________ Date: ____________ Movements: ____________ Start time: ____________ Finish time: ____________ Date: ____________ Movements: ____________ Start time:   ____________ Finish time: ____________ Date: ____________ Movements: ____________ Start time: ____________ Finish time: ____________ Date: ____________ Movements: ____________ Start time: ____________ Finish time: ____________ Date: ____________ Movements: ____________ Start time: ____________ Finish time: ____________ Date: ____________ Movements: ____________ Start time: ____________ Finish time: ____________  Date: ____________ Movements: ____________ Start time: ____________ Finish time: ____________ Date: ____________ Movements: ____________ Start time: ____________ Finish time: ____________ Date: ____________ Movements: ____________ Start time: ____________ Finish time: ____________ Date: ____________ Movements: ____________ Start time: ____________ Finish time: ____________ Date: ____________ Movements: ____________ Start time: ____________ Finish time: ____________ Date: ____________ Movements: ____________ Start time: ____________ Finish time: ____________ Date: ____________ Movements: ____________ Start time: ____________ Finish time: ____________  Date: ____________ Movements: ____________ Start time: ____________ Finish time: ____________ Date: ____________ Movements: ____________ Start time: ____________ Finish time: ____________ Date: ____________ Movements: ____________ Start time: ____________ Finish  time: ____________ Date: ____________ Movements: ____________ Start time: ____________ Finish time: ____________ Date: ____________ Movements: ____________ Start time: ____________ Finish time: ____________ Date: ____________ Movements: ____________ Start time: ____________ Finish time: ____________ Date: ____________ Movements: ____________ Start time: ____________ Finish time: ____________  Date: ____________ Movements: ____________ Start time: ____________ Finish time: ____________ Date: ____________ Movements: ____________ Start time: ____________ Finish time: ____________ Date: ____________ Movements: ____________ Start time: ____________ Finish time: ____________ Date: ____________ Movements: ____________ Start time: ____________ Finish time: ____________ Date: ____________ Movements: ____________ Start time: ____________ Finish time: ____________ Date: ____________ Movements: ____________ Start time: ____________ Finish time: ____________ Document Released: 11/04/2006 Document Revised: 09/24/2011 Document Reviewed: 05/07/2009 [employer] Patient Information 2012 [employer]. 

## 2012-03-19 NOTE — Progress Notes (Signed)
See previous note

## 2012-03-21 ENCOUNTER — Other Ambulatory Visit: Payer: Self-pay | Admitting: Obstetrics and Gynecology

## 2012-03-21 ENCOUNTER — Other Ambulatory Visit: Payer: Self-pay

## 2012-03-21 ENCOUNTER — Ambulatory Visit (INDEPENDENT_AMBULATORY_CARE_PROVIDER_SITE_OTHER): Payer: 59 | Admitting: Obstetrics and Gynecology

## 2012-03-21 ENCOUNTER — Ambulatory Visit (INDEPENDENT_AMBULATORY_CARE_PROVIDER_SITE_OTHER): Payer: 59

## 2012-03-21 VITALS — BP 104/66

## 2012-03-21 DIAGNOSIS — O139 Gestational [pregnancy-induced] hypertension without significant proteinuria, unspecified trimester: Secondary | ICD-10-CM

## 2012-03-21 DIAGNOSIS — O289 Unspecified abnormal findings on antenatal screening of mother: Secondary | ICD-10-CM

## 2012-03-21 DIAGNOSIS — R809 Proteinuria, unspecified: Secondary | ICD-10-CM

## 2012-03-21 DIAGNOSIS — O288 Other abnormal findings on antenatal screening of mother: Secondary | ICD-10-CM

## 2012-03-21 DIAGNOSIS — IMO0002 Reserved for concepts with insufficient information to code with codable children: Secondary | ICD-10-CM

## 2012-03-21 LAB — COMPREHENSIVE METABOLIC PANEL
ALT: 8 U/L (ref 0–35)
AST: 11 U/L (ref 0–37)
Albumin: 2.5 g/dL — ABNORMAL LOW (ref 3.5–5.2)
BUN: 6 mg/dL (ref 6–23)
Calcium: 9.5 mg/dL (ref 8.4–10.5)
Chloride: 102 mEq/L (ref 96–112)
Potassium: 4 mEq/L (ref 3.5–5.3)
Sodium: 138 mEq/L (ref 135–145)
Total Protein: 5.8 g/dL — ABNORMAL LOW (ref 6.0–8.3)

## 2012-03-21 LAB — US FETAL BPP WO NON STRESS

## 2012-03-21 LAB — CBC
Hemoglobin: 9.9 g/dL — ABNORMAL LOW (ref 12.0–15.0)
MCH: 23.5 pg — ABNORMAL LOW (ref 26.0–34.0)
MCHC: 31.3 g/dL (ref 30.0–36.0)
Platelets: 189 10*3/uL (ref 150–400)
RDW: 17.4 % — ABNORMAL HIGH (ref 11.5–15.5)

## 2012-03-21 LAB — URIC ACID: Uric Acid, Serum: 5.1 mg/dL (ref 2.4–7.0)

## 2012-03-21 LAB — LACTATE DEHYDROGENASE: LDH: 153 U/L (ref 94–250)

## 2012-03-21 NOTE — Progress Notes (Signed)
NST only. Pt denies any complaints. Reviewed results with Dr Alinda Sierras. Orders for labs and BPP given.

## 2012-03-22 ENCOUNTER — Other Ambulatory Visit: Payer: Self-pay | Admitting: Obstetrics and Gynecology

## 2012-03-22 ENCOUNTER — Other Ambulatory Visit: Payer: 59

## 2012-03-22 DIAGNOSIS — R809 Proteinuria, unspecified: Secondary | ICD-10-CM

## 2012-03-23 LAB — CREATININE CLEARANCE, URINE, 24 HOUR
Creatinine, 24H Ur: 2942 mg/d — ABNORMAL HIGH (ref 700–1800)
Creatinine, Urine: 117.7 mg/dL
Creatinine: 0.76 mg/dL (ref 0.50–1.10)

## 2012-03-23 LAB — PROTEIN, URINE, 24 HOUR: Protein, Urine: 10 mg/dL

## 2012-03-23 NOTE — Telephone Encounter (Signed)
To close this encounter. Mathis Bud

## 2012-03-23 NOTE — Telephone Encounter (Signed)
To close encounter. Lauren Walls  

## 2012-03-24 ENCOUNTER — Ambulatory Visit (HOSPITAL_COMMUNITY): Payer: 59

## 2012-03-24 ENCOUNTER — Ambulatory Visit (INDEPENDENT_AMBULATORY_CARE_PROVIDER_SITE_OTHER): Payer: 59 | Admitting: Obstetrics and Gynecology

## 2012-03-24 ENCOUNTER — Other Ambulatory Visit: Payer: 59

## 2012-03-24 ENCOUNTER — Other Ambulatory Visit (INDEPENDENT_AMBULATORY_CARE_PROVIDER_SITE_OTHER): Payer: 59

## 2012-03-24 ENCOUNTER — Encounter: Payer: Self-pay | Admitting: Obstetrics and Gynecology

## 2012-03-24 ENCOUNTER — Other Ambulatory Visit: Payer: Self-pay | Admitting: Obstetrics and Gynecology

## 2012-03-24 VITALS — BP 124/82 | Wt 288.0 lb

## 2012-03-24 DIAGNOSIS — Z331 Pregnant state, incidental: Secondary | ICD-10-CM

## 2012-03-24 DIAGNOSIS — Z3490 Encounter for supervision of normal pregnancy, unspecified, unspecified trimester: Secondary | ICD-10-CM

## 2012-03-24 DIAGNOSIS — R809 Proteinuria, unspecified: Secondary | ICD-10-CM

## 2012-03-24 DIAGNOSIS — O10919 Unspecified pre-existing hypertension complicating pregnancy, unspecified trimester: Secondary | ICD-10-CM

## 2012-03-24 DIAGNOSIS — O10019 Pre-existing essential hypertension complicating pregnancy, unspecified trimester: Secondary | ICD-10-CM

## 2012-03-24 DIAGNOSIS — I10 Essential (primary) hypertension: Secondary | ICD-10-CM

## 2012-03-24 LAB — COMPREHENSIVE METABOLIC PANEL
Alkaline Phosphatase: 133 U/L — ABNORMAL HIGH (ref 39–117)
BUN: 7 mg/dL (ref 6–23)
CO2: 24 mEq/L (ref 19–32)
Creat: 0.78 mg/dL (ref 0.50–1.10)
Glucose, Bld: 77 mg/dL (ref 70–99)
Sodium: 138 mEq/L (ref 135–145)
Total Bilirubin: 0.2 mg/dL — ABNORMAL LOW (ref 0.3–1.2)

## 2012-03-24 LAB — LACTATE DEHYDROGENASE, ISOENZYMES

## 2012-03-24 LAB — CBC
HCT: 33.9 % — ABNORMAL LOW (ref 36.0–46.0)
Hemoglobin: 10.6 g/dL — ABNORMAL LOW (ref 12.0–15.0)
MCH: 23.8 pg — ABNORMAL LOW (ref 26.0–34.0)
MCHC: 31.3 g/dL (ref 30.0–36.0)
MCV: 76.2 fL — ABNORMAL LOW (ref 78.0–100.0)

## 2012-03-24 LAB — LACTATE DEHYDROGENASE: LDH: 131 U/L (ref 94–250)

## 2012-03-24 IMAGING — US US OB COMP +14 WK
1 of 2 series · 12 of 28 positions shown · non-contrast
Comparison: none

[Series 1: us ob comp +14 wk · 65 acquisitions, 12 frames shown]
[im 1/65]
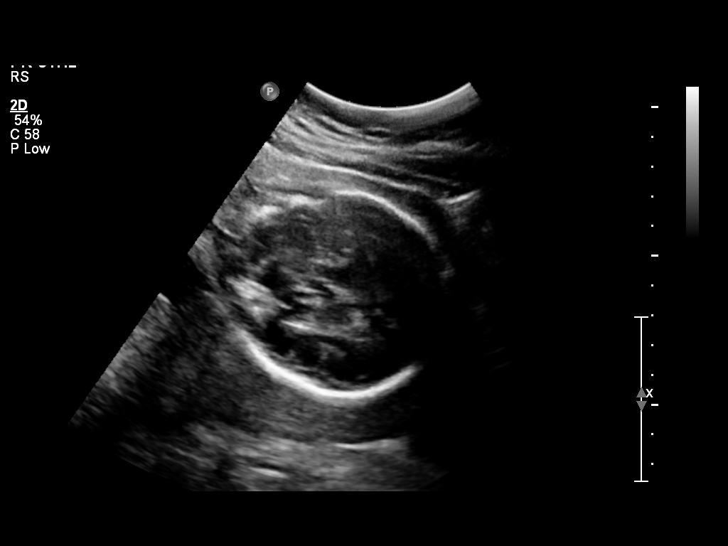
[im 5/65]
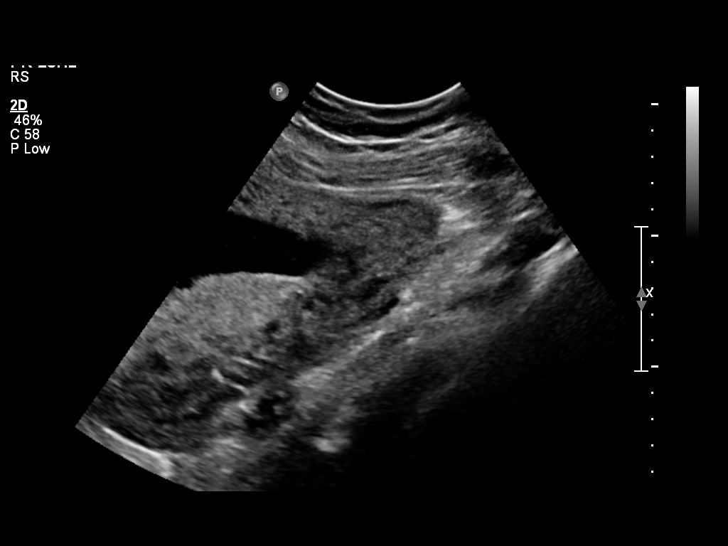
[im 10/65]
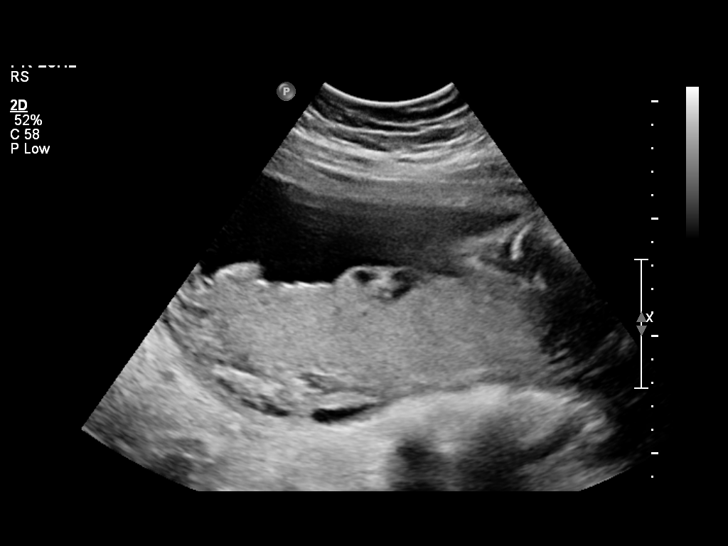
[im 18/65]
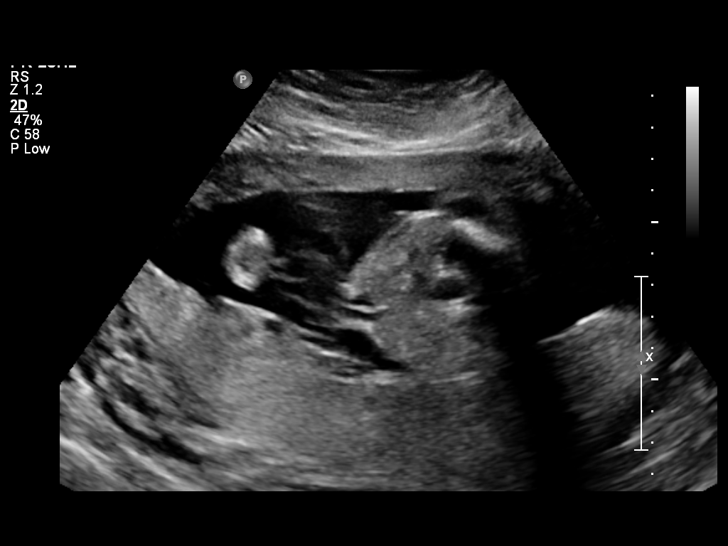
[im 23/65]
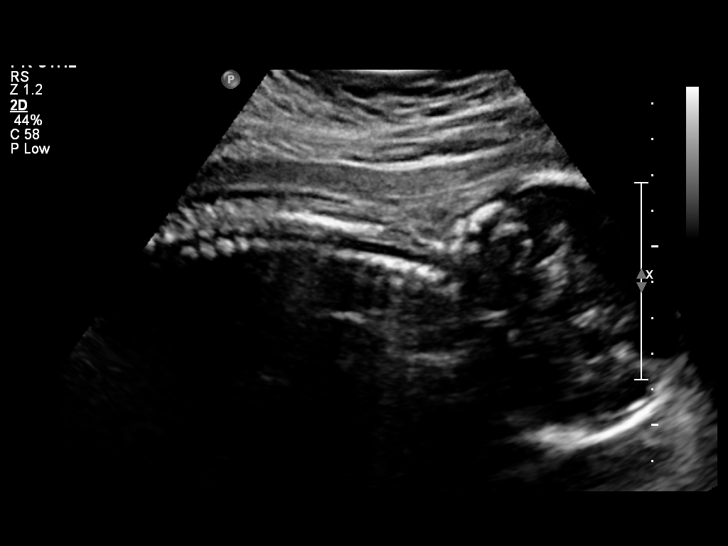
[im 28/65]
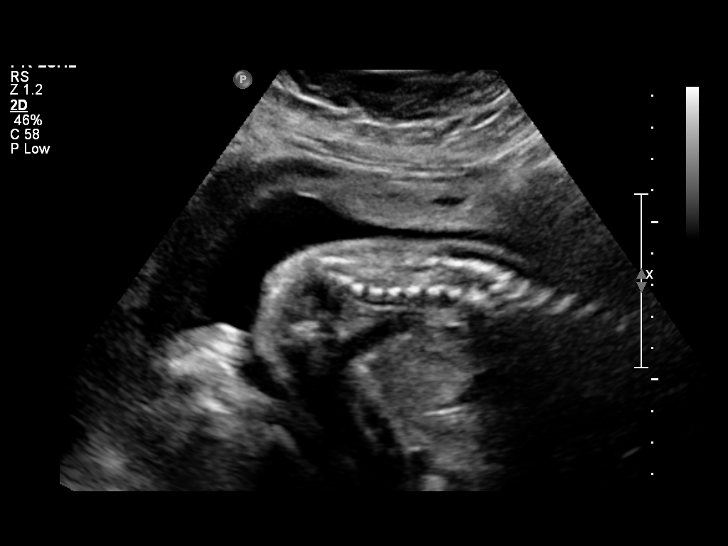
[im 35/65]
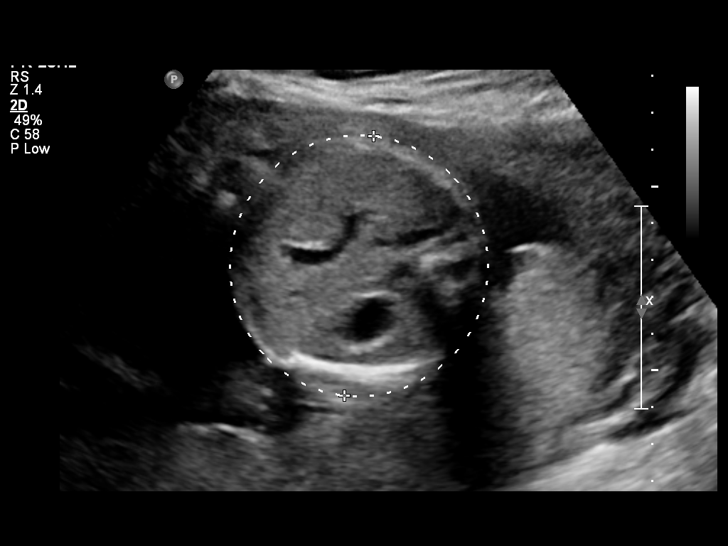
[im 40/65]
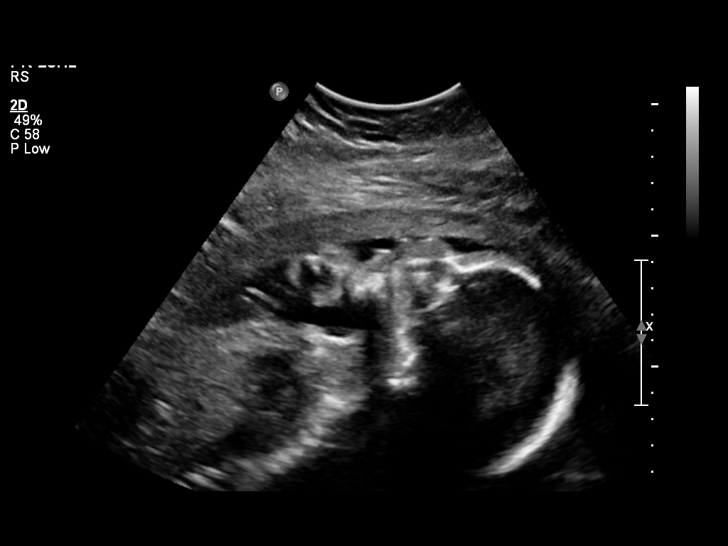
[im 45/65]
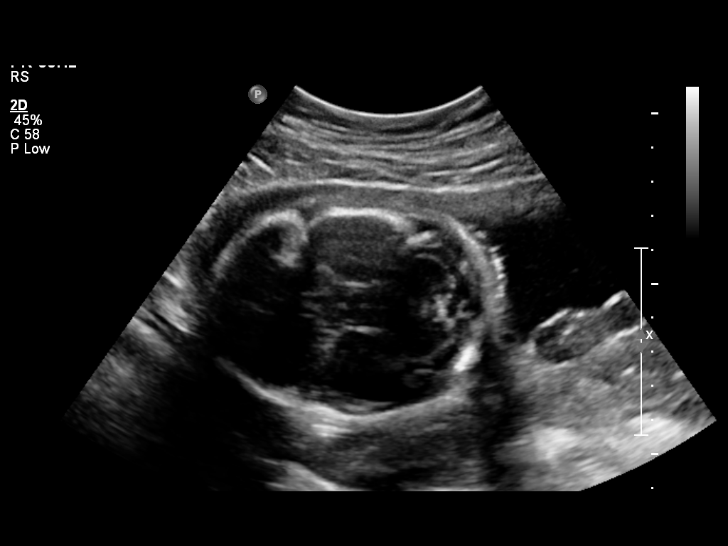
[im 52/65]
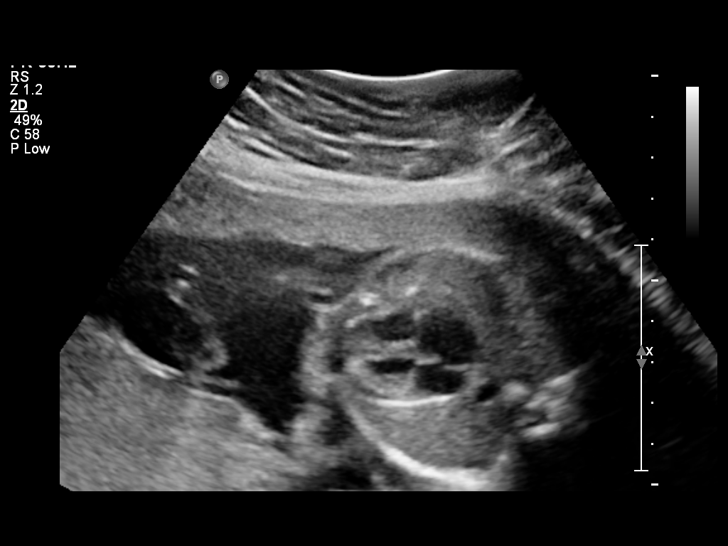
[im 57/65]
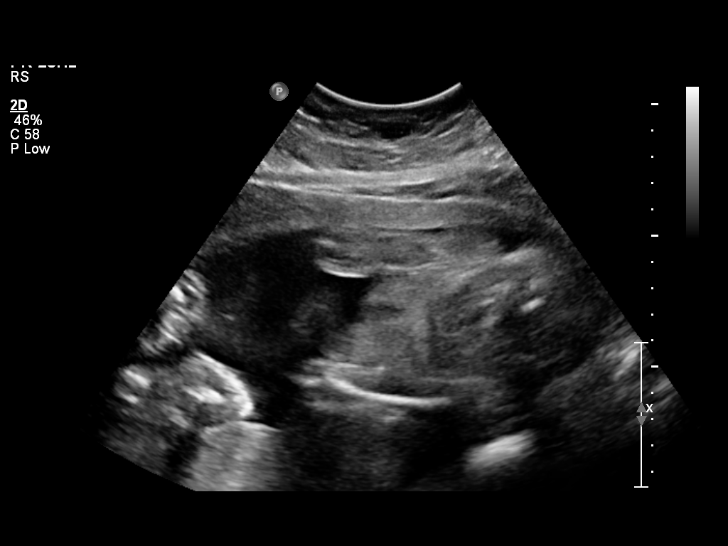
[im 62/65]
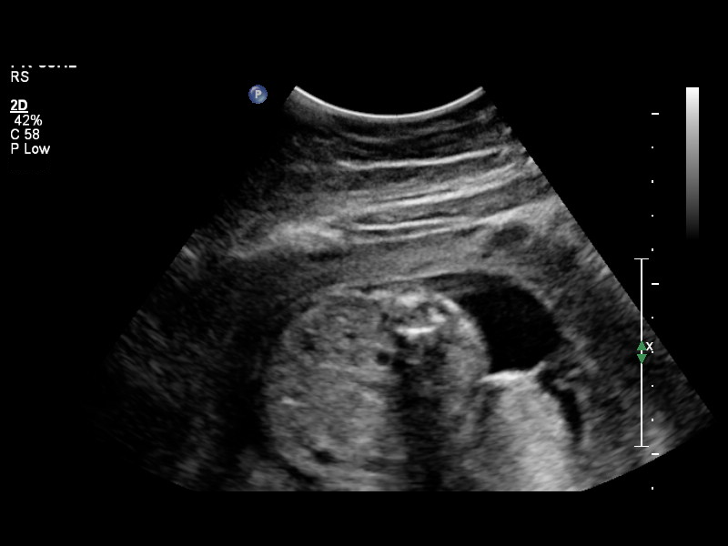

[12 of 28 positions shown; findings below may reference images not displayed]

OBSTETRICS REPORT
                      (Signed Final 01/07/2012 [DATE])

 Order#:         [id number]
Procedures

 US OB COMP + 14 WK                                    76805.1
Indications

 Routine fetal anatomic survey
 Hypertension - Chronic/Pre-existing
 Assess Fetal Growth / Estimated Fetal Weight
Fetal Evaluation

 Fetal Heart Rate:  155                         bpm
 Cardiac Activity:  Observed
 Presentation:      Cephalic
 Placenta:          Posterior, above cervical
                    os
 P. Cord            Visualized
 Insertion:

 Amniotic Fluid
 AFI FV:      Subjectively low-normal
                                             Larg Pckt:     6.3  cm
Biometry

 BPD:     65.3  mm    G. Age:   26w 3d                CI:        67.02   70 - 86
                                                      FL/HC:      19.8   18.6 -

 HC:     255.5  mm    G. Age:   27w 5d       64  %    HC/AC:      1.14   1.05 -

 AC:     223.2  mm    G. Age:   26w 5d       47  %    FL/BPD:     77.5   71 - 87
 FL:      50.6  mm    G. Age:   27w 1d       53  %    FL/AC:      22.7   20 - 24

 Est. FW:    0887  gm      2 lb 4 oz     61  %
Gestational Age

 U/S Today:     27w 0d                                        EDD:   04/07/12
 Best:          26w 4d    Det. By:   Early Ultrasound         EDD:   04/10/12
Anatomy
 Cranium:           Appears normal      Aortic Arch:       Basic anatomy
                                                           exam per order
 Fetal Cavum:       Appears normal      Ductal Arch:       Basic anatomy
                                                           exam per order
 Ventricles:        Appears normal      Diaphragm:         Appears normal
 Choroid Plexus:    Appears normal      Stomach:           Appears
                                                           normal, left
                                                           sided
 Cerebellum:        Appears normal      Abdomen:           Appears normal
 Posterior Fossa:   Appears normal      Abdominal Wall:    Appears nml
                                                           (cord insert,
                                                           abd wall)
 Nuchal Fold:       Not applicable      Cord Vessels:      Appears normal
                    (>20 wks GA)                           (3 vessel cord)
 Face:              Lips appear         Kidneys:           Appear normal
                    normal
 Heart:             Appears normal      Bladder:           Appears normal
                    (4 chamber &
                    axis)
 RVOT:              Not well            Spine:             Appears normal
                    visualized
 LVOT:              Not well            Limbs:             Four extremities
                    visualized                             seen

 Other:     Technically difficult due to fetal position.
Cervix Uterus Adnexa

 Cervical Length:   3.2       cm

 Cervix:       Closed. Normal appearance by transabdominal scan.

 Adnexa:     No abnormality visualized.
Impression

 Single intrauterine gestation demonstrating an estimated
 gestational age by ultrasound of 27w 0d. This is correlated
 with expected estimated gestational age by early ultrasound
 of 26w 4d. EFW is currently at the 61%.

 Visualized fetal anatomay appears normal. No late
 developing fetal anatomic abnormalities are noted associated
 with the lateral ventricles, four chamber heart, stomach,
 kidneys or bladder.

 Subjectively low normal  amniotic fluid volume with a single
 largest pocket measuring 6.3 cm.

 Normal cervical length and appearance.

## 2012-03-24 MED ORDER — LEVONORGESTREL 20 MCG/24HR IU IUD: INTRAUTERINE_SYSTEM | Freq: Once | INTRAUTERINE | Status: DC

## 2012-03-24 NOTE — Progress Notes (Signed)
Addended by: Marla Roe A on: 03/24/2012 03:33 PM   Modules accepted: Orders

## 2012-03-24 NOTE — Progress Notes (Addendum)
C/o LLE tenderness started yest 24hr urine to hand in on Mon U/S BPP 8/8, AFI 17.4, vtx, post placenta LLE with calf tenderness no swelling tr -1+edema, 49cm each calf Doppler of LLE - called by imaging ctr with neg results BPP on Mon and Thurs Will sched indxn at 39wks secondary to [facility] on labetalol Pt to do another 24 hr urine to hand in Mon secondary to proteinuria Urine with 250mg  protein 03/21/12

## 2012-03-25 ENCOUNTER — Other Ambulatory Visit: Payer: Self-pay

## 2012-03-25 DIAGNOSIS — Z331 Pregnant state, incidental: Secondary | ICD-10-CM

## 2012-03-28 ENCOUNTER — Ambulatory Visit (INDEPENDENT_AMBULATORY_CARE_PROVIDER_SITE_OTHER): Payer: 59 | Admitting: Obstetrics and Gynecology

## 2012-03-28 ENCOUNTER — Telehealth: Payer: Self-pay | Admitting: Obstetrics and Gynecology

## 2012-03-28 ENCOUNTER — Ambulatory Visit (INDEPENDENT_AMBULATORY_CARE_PROVIDER_SITE_OTHER): Payer: 59

## 2012-03-28 VITALS — BP 128/78 | Wt 288.0 lb

## 2012-03-28 DIAGNOSIS — I10 Essential (primary) hypertension: Secondary | ICD-10-CM

## 2012-03-28 DIAGNOSIS — R809 Proteinuria, unspecified: Secondary | ICD-10-CM

## 2012-03-28 DIAGNOSIS — O09529 Supervision of elderly multigravida, unspecified trimester: Secondary | ICD-10-CM

## 2012-03-28 LAB — CREATININE CLEARANCE, URINE, 24 HOUR: Creatinine: 0.78 mg/dL (ref 0.50–1.10)

## 2012-03-28 LAB — PROTEIN, URINE, 24 HOUR: Protein, 24H Urine: 195 mg/d — ABNORMAL HIGH (ref 50–100)

## 2012-03-28 NOTE — Telephone Encounter (Signed)
Call to pt with rresults of 24 hr urine abn results but improving from last week protein 195 last result was 250 pt will keep appt as scheduled Wednesday DFaulconer RN

## 2012-03-28 NOTE — Progress Notes (Signed)
Pt denies ha's, dizziness, or visual changes. 24 hr urine:  Protein=195 Ultrasound showed adequate growth BPP=8/8 Pt needed to leave before seeing MD secondary to MD delay. Will F/U 03/30/12

## 2012-03-29 ENCOUNTER — Telehealth: Payer: Self-pay | Admitting: Obstetrics and Gynecology

## 2012-03-29 ENCOUNTER — Other Ambulatory Visit: Payer: Self-pay | Admitting: Obstetrics and Gynecology

## 2012-03-29 DIAGNOSIS — I10 Essential (primary) hypertension: Secondary | ICD-10-CM

## 2012-03-29 DIAGNOSIS — R809 Proteinuria, unspecified: Secondary | ICD-10-CM

## 2012-03-29 LAB — US OB FOLLOW UP

## 2012-03-30 ENCOUNTER — Ambulatory Visit (INDEPENDENT_AMBULATORY_CARE_PROVIDER_SITE_OTHER): Payer: 59 | Admitting: Obstetrics and Gynecology

## 2012-03-30 ENCOUNTER — Ambulatory Visit (INDEPENDENT_AMBULATORY_CARE_PROVIDER_SITE_OTHER): Payer: 59

## 2012-03-30 ENCOUNTER — Encounter: Payer: Self-pay | Admitting: Obstetrics and Gynecology

## 2012-03-30 VITALS — BP 120/72 | Wt 288.0 lb

## 2012-03-30 DIAGNOSIS — O139 Gestational [pregnancy-induced] hypertension without significant proteinuria, unspecified trimester: Secondary | ICD-10-CM

## 2012-03-30 DIAGNOSIS — Z3490 Encounter for supervision of normal pregnancy, unspecified, unspecified trimester: Secondary | ICD-10-CM

## 2012-03-30 DIAGNOSIS — Z331 Pregnant state, incidental: Secondary | ICD-10-CM

## 2012-03-30 NOTE — Progress Notes (Signed)
No complaints  Declines VE

## 2012-03-30 NOTE — Progress Notes (Signed)
Doing well BPP 8/8, vtx, nl fluid Questions answered re indxn R/B/A reviewed incl slinght inc risk of c/s Pt would like to proceed Indxn scheduled for 7:15 evening of 04/02/12 [facility] Labor Precautions PIH precautions

## 2012-04-02 ENCOUNTER — Other Ambulatory Visit: Payer: Self-pay | Admitting: Obstetrics and Gynecology

## 2012-04-02 ENCOUNTER — Encounter (HOSPITAL_COMMUNITY): Payer: Self-pay

## 2012-04-02 ENCOUNTER — Inpatient Hospital Stay (HOSPITAL_COMMUNITY)
Admission: RE | Admit: 2012-04-02 | Discharge: 2012-04-07 | DRG: 765 | Disposition: A | Discharge status: Home / Self Care | Payer: 59 | Source: Ambulatory Visit | Attending: Obstetrics and Gynecology | Admitting: Obstetrics and Gynecology

## 2012-04-02 VITALS — BP 148/83 | HR 74 | Temp 98.1°F | Resp 18 | Ht 68.0 in | Wt 288.0 lb

## 2012-04-02 DIAGNOSIS — O119 Pre-existing hypertension with pre-eclampsia, unspecified trimester: Secondary | ICD-10-CM

## 2012-04-02 DIAGNOSIS — O1490 Unspecified pre-eclampsia, unspecified trimester: Secondary | ICD-10-CM | POA: Diagnosis present

## 2012-04-02 DIAGNOSIS — Z98891 History of uterine scar from previous surgery: Secondary | ICD-10-CM

## 2012-04-02 DIAGNOSIS — Z6791 Unspecified blood type, Rh negative: Secondary | ICD-10-CM | POA: Diagnosis present

## 2012-04-02 DIAGNOSIS — IMO0002 Reserved for concepts with insufficient information to code with codable children: Secondary | ICD-10-CM | POA: Diagnosis present

## 2012-04-02 DIAGNOSIS — D649 Anemia, unspecified: Secondary | ICD-10-CM | POA: Diagnosis present

## 2012-04-02 DIAGNOSIS — I10 Essential (primary) hypertension: Secondary | ICD-10-CM | POA: Diagnosis present

## 2012-04-02 DIAGNOSIS — O1002 Pre-existing essential hypertension complicating childbirth: Secondary | ICD-10-CM | POA: Diagnosis present

## 2012-04-02 DIAGNOSIS — F418 Other specified anxiety disorders: Secondary | ICD-10-CM | POA: Diagnosis present

## 2012-04-02 LAB — CBC
Hemoglobin: 10 g/dL — ABNORMAL LOW (ref 12.0–15.0)
MCH: 23.8 pg — ABNORMAL LOW (ref 26.0–34.0)
MCHC: 31.4 g/dL (ref 30.0–36.0)
MCV: 75.5 fL — ABNORMAL LOW (ref 78.0–100.0)
RBC: 4.21 MIL/uL (ref 3.87–5.11)

## 2012-04-02 LAB — COMPREHENSIVE METABOLIC PANEL
BUN: 7 mg/dL (ref 6–23)
CO2: 22 mEq/L (ref 19–32)
Calcium: 9.1 mg/dL (ref 8.4–10.5)
Chloride: 102 mEq/L (ref 96–112)
Creatinine, Ser: 0.71 mg/dL (ref 0.50–1.10)
GFR calc Af Amer: >90 mL/min (ref >90)
GFR calc non Af Amer: >90 mL/min (ref >90)
Total Bilirubin: 0.2 mg/dL — ABNORMAL LOW (ref 0.3–1.2)

## 2012-04-02 LAB — LACTATE DEHYDROGENASE: LDH: 186 U/L (ref 94–250)

## 2012-04-02 LAB — URIC ACID: Uric Acid, Serum: 4.6 mg/dL (ref 2.4–7.0)

## 2012-04-02 MED ORDER — LACTATED RINGERS IV SOLN
INTRAVENOUS | Status: DC
  Administered 2012-04-02 – 2012-04-03 (×3): via INTRAVENOUS

## 2012-04-02 MED ORDER — ZOLPIDEM TARTRATE 10 MG PO TABS: 10.0000 mg | ORAL_TABLET | Freq: Every evening | ORAL | Status: DC | PRN

## 2012-04-02 MED ORDER — CITRIC ACID-SODIUM CITRATE 334-500 MG/5ML PO SOLN
30.0000 mL | ORAL | Status: DC | PRN
  Administered 2012-04-03: 30 mL via ORAL
  Filled 2012-04-02: qty 15

## 2012-04-02 MED ORDER — BUTORPHANOL TARTRATE 2 MG/ML IJ SOLN: 1.0000 mg | INTRAMUSCULAR | Status: DC | PRN

## 2012-04-02 MED ORDER — IBUPROFEN 600 MG PO TABS: 600.0000 mg | ORAL_TABLET | Freq: Four times a day (QID) | ORAL | Status: DC | PRN

## 2012-04-02 MED ORDER — MISOPROSTOL 25 MCG QUARTER TABLET
25.0000 ug | ORAL_TABLET | ORAL | Status: DC | PRN
  Administered 2012-04-02 – 2012-04-03 (×2): 25 ug via VAGINAL
  Filled 2012-04-02 (×2): qty 0.25

## 2012-04-02 MED ORDER — FLEET ENEMA 7-19 GM/118ML RE ENEM: 1.0000 | ENEMA | RECTAL | Status: DC | PRN

## 2012-04-02 MED ORDER — TERBUTALINE SULFATE 1 MG/ML IJ SOLN: 0.2500 mg | Freq: Once | INTRAMUSCULAR | Status: AC | PRN

## 2012-04-02 MED ORDER — OXYTOCIN 20 UNITS IN LACTATED RINGERS INFUSION - SIMPLE: 125.0000 mL/h | Freq: Once | INTRAVENOUS | Status: DC

## 2012-04-02 MED ORDER — LABETALOL HCL 200 MG PO TABS
200.0000 mg | ORAL_TABLET | Freq: Two times a day (BID) | ORAL | Status: DC
  Administered 2012-04-03: 200 mg via ORAL
  Filled 2012-04-02 (×4): qty 1

## 2012-04-02 MED ORDER — LACTATED RINGERS IV SOLN
500.0000 mL | INTRAVENOUS | Status: DC | PRN
  Administered 2012-04-02: 350 mL via INTRAVENOUS
  Administered 2012-04-03: 500 mL via INTRAVENOUS

## 2012-04-02 MED ORDER — ONDANSETRON HCL 4 MG/2ML IJ SOLN: 4.0000 mg | Freq: Four times a day (QID) | INTRAMUSCULAR | Status: DC | PRN

## 2012-04-02 MED ORDER — LIDOCAINE HCL (PF) 1 % IJ SOLN: 30.0000 mL | INTRAMUSCULAR | Status: DC | PRN

## 2012-04-02 MED ORDER — OXYTOCIN 20 UNITS IN LACTATED RINGERS INFUSION - SIMPLE
1.0000 m[IU]/min | INTRAVENOUS | Status: DC
  Administered 2012-04-03: 2 m[IU]/min via INTRAVENOUS
  Filled 2012-04-02: qty 1000

## 2012-04-02 MED ORDER — OXYTOCIN BOLUS FROM INFUSION
500.0000 mL | Freq: Once | INTRAVENOUS | Status: DC
  Filled 2012-04-02: qty 500

## 2012-04-02 MED ORDER — ACETAMINOPHEN 325 MG PO TABS: 650.0000 mg | ORAL_TABLET | ORAL | Status: DC | PRN

## 2012-04-02 MED ORDER — OXYCODONE-ACETAMINOPHEN 5-325 MG PO TABS: 1.0000 | ORAL_TABLET | ORAL | Status: DC | PRN

## 2012-04-02 NOTE — Progress Notes (Signed)
Called Nona to review strip and evaluate if ok to place cytotec. CNM aware of nursing interventions. OK to place at this time.

## 2012-04-02 NOTE — Progress Notes (Signed)
Katrinka Blazing CNM at bedside discussing plan of care with pt. Continue pt on monitor until reassuring  Tracing. Place cytotec after tracing is reassuring.

## 2012-04-02 NOTE — Progress Notes (Signed)
Called Smith CNM and told of pt arrival and admitted. CNM will come see pt.

## 2012-04-03 ENCOUNTER — Encounter (HOSPITAL_COMMUNITY): Payer: Self-pay | Admitting: Anesthesiology

## 2012-04-03 ENCOUNTER — Inpatient Hospital Stay (HOSPITAL_COMMUNITY): Payer: 59

## 2012-04-03 ENCOUNTER — Encounter (HOSPITAL_COMMUNITY): Payer: Self-pay

## 2012-04-03 ENCOUNTER — Inpatient Hospital Stay (HOSPITAL_COMMUNITY): Payer: 59 | Admitting: Anesthesiology

## 2012-04-03 ENCOUNTER — Encounter (HOSPITAL_COMMUNITY)
Admission: RE | Disposition: A | Discharge status: Home / Self Care | Payer: Self-pay | Source: Ambulatory Visit | Attending: Obstetrics and Gynecology

## 2012-04-03 LAB — CBC
HCT: 33.4 % — ABNORMAL LOW (ref 36.0–46.0)
MCH: 23.5 pg — ABNORMAL LOW (ref 26.0–34.0)
MCHC: 31.1 g/dL (ref 30.0–36.0)
MCV: 75.4 fL — ABNORMAL LOW (ref 78.0–100.0)
RDW: 17.4 % — ABNORMAL HIGH (ref 11.5–15.5)

## 2012-04-03 SURGERY — Surgical Case
Anesthesia: General | Site: Abdomen | Wound class: Clean Contaminated

## 2012-04-03 MED ORDER — MENTHOL 3 MG MT LOZG: 1.0000 | LOZENGE | OROMUCOSAL | Status: DC | PRN

## 2012-04-03 MED ORDER — OXYCODONE-ACETAMINOPHEN 5-325 MG PO TABS
1.0000 | ORAL_TABLET | ORAL | Status: DC | PRN
  Administered 2012-04-04 – 2012-04-06 (×7): 1 via ORAL
  Filled 2012-04-03 (×7): qty 1

## 2012-04-03 MED ORDER — LACTATED RINGERS IV SOLN
500.0000 mL | Freq: Once | INTRAVENOUS | Status: AC
  Administered 2012-04-03: 15:00:00 via INTRAVENOUS

## 2012-04-03 MED ORDER — MIDAZOLAM HCL 2 MG/2ML IJ SOLN
INTRAMUSCULAR | Status: AC
  Filled 2012-04-03: qty 2

## 2012-04-03 MED ORDER — OXYTOCIN 20 UNITS IN LACTATED RINGERS INFUSION - SIMPLE
INTRAVENOUS | Status: DC | PRN
  Administered 2012-04-03: 40 [IU] via INTRAVENOUS

## 2012-04-03 MED ORDER — SODIUM CHLORIDE 0.9 % IJ SOLN: 9.0000 mL | INTRAMUSCULAR | Status: DC | PRN

## 2012-04-03 MED ORDER — ONDANSETRON HCL 4 MG/2ML IJ SOLN: 4.0000 mg | INTRAMUSCULAR | Status: DC | PRN

## 2012-04-03 MED ORDER — SIMETHICONE 80 MG PO CHEW: 80.0000 mg | CHEWABLE_TABLET | ORAL | Status: DC | PRN

## 2012-04-03 MED ORDER — EPHEDRINE SULFATE 50 MG/ML IJ SOLN
INTRAMUSCULAR | Status: DC | PRN
  Administered 2012-04-03: 20 mg via INTRAVENOUS

## 2012-04-03 MED ORDER — SIMETHICONE 80 MG PO CHEW
80.0000 mg | CHEWABLE_TABLET | Freq: Three times a day (TID) | ORAL | Status: DC
  Administered 2012-04-03 – 2012-04-06 (×12): 80 mg via ORAL

## 2012-04-03 MED ORDER — ZOLPIDEM TARTRATE 5 MG PO TABS: 5.0000 mg | ORAL_TABLET | Freq: Every evening | ORAL | Status: DC | PRN

## 2012-04-03 MED ORDER — ONDANSETRON HCL 4 MG/2ML IJ SOLN: 4.0000 mg | Freq: Four times a day (QID) | INTRAMUSCULAR | Status: DC | PRN

## 2012-04-03 MED ORDER — FENTANYL 2.5 MCG/ML BUPIVACAINE 1/10 % EPIDURAL INFUSION (WH - ANES)
14.0000 mL/h | INTRAMUSCULAR | Status: DC
  Filled 2012-04-03: qty 60

## 2012-04-03 MED ORDER — OXYTOCIN 20 UNITS IN LACTATED RINGERS INFUSION - SIMPLE: 125.0000 mL/h | INTRAVENOUS | Status: DC

## 2012-04-03 MED ORDER — SERTRALINE HCL 100 MG PO TABS
100.0000 mg | ORAL_TABLET | Freq: Every day | ORAL | Status: DC
  Administered 2012-04-03 – 2012-04-06 (×4): 100 mg via ORAL
  Filled 2012-04-03 (×6): qty 1

## 2012-04-03 MED ORDER — KETOROLAC TROMETHAMINE 30 MG/ML IJ SOLN
INTRAMUSCULAR | Status: AC
  Filled 2012-04-03: qty 1

## 2012-04-03 MED ORDER — PRENATAL MULTIVITAMIN CH
1.0000 | ORAL_TABLET | Freq: Every day | ORAL | Status: DC
  Administered 2012-04-05 – 2012-04-06 (×2): 1 via ORAL
  Filled 2012-04-03 (×2): qty 1

## 2012-04-03 MED ORDER — LACTATED RINGERS IV SOLN
INTRAVENOUS | Status: DC | PRN
  Administered 2012-04-03: 16:00:00 via INTRAVENOUS

## 2012-04-03 MED ORDER — HYDROMORPHONE 0.3 MG/ML IV SOLN: INTRAVENOUS | Status: DC

## 2012-04-03 MED ORDER — PHENYLEPHRINE HCL 10 MG/ML IJ SOLN
INTRAMUSCULAR | Status: DC | PRN
  Administered 2012-04-03 (×8): 40 ug via INTRAVENOUS
  Administered 2012-04-03: 4 ug via INTRAVENOUS

## 2012-04-03 MED ORDER — PHENYLEPHRINE 40 MCG/ML (10ML) SYRINGE FOR IV PUSH (FOR BLOOD PRESSURE SUPPORT): 80.0000 ug | PREFILLED_SYRINGE | INTRAVENOUS | Status: DC | PRN

## 2012-04-03 MED ORDER — EPHEDRINE 5 MG/ML INJ: 10.0000 mg | INTRAVENOUS | Status: DC | PRN

## 2012-04-03 MED ORDER — SUCCINYLCHOLINE CHLORIDE 20 MG/ML IJ SOLN
INTRAMUSCULAR | Status: DC | PRN
  Administered 2012-04-03: 140 mg via INTRAVENOUS

## 2012-04-03 MED ORDER — DIPHENHYDRAMINE HCL 50 MG/ML IJ SOLN: 12.5000 mg | INTRAMUSCULAR | Status: DC | PRN

## 2012-04-03 MED ORDER — DIPHENHYDRAMINE HCL 25 MG PO CAPS
25.0000 mg | ORAL_CAPSULE | Freq: Four times a day (QID) | ORAL | Status: DC | PRN
  Filled 2012-04-03: qty 1

## 2012-04-03 MED ORDER — HEPARIN SODIUM (PORCINE) 5000 UNIT/ML IJ SOLN
5000.0000 [IU] | Freq: Two times a day (BID) | INTRAMUSCULAR | Status: DC
  Administered 2012-04-03 – 2012-04-06 (×7): 5000 [IU] via SUBCUTANEOUS
  Filled 2012-04-03 (×10): qty 1

## 2012-04-03 MED ORDER — SENNOSIDES-DOCUSATE SODIUM 8.6-50 MG PO TABS
2.0000 | ORAL_TABLET | Freq: Every day | ORAL | Status: DC
  Administered 2012-04-03 – 2012-04-06 (×4): 2 via ORAL

## 2012-04-03 MED ORDER — DEXAMETHASONE SODIUM PHOSPHATE 10 MG/ML IJ SOLN
INTRAMUSCULAR | Status: DC | PRN
  Administered 2012-04-03: 10 mg via INTRAVENOUS

## 2012-04-03 MED ORDER — HYDROMORPHONE HCL PF 1 MG/ML IJ SOLN: 0.2500 mg | INTRAMUSCULAR | Status: DC | PRN

## 2012-04-03 MED ORDER — DIBUCAINE 1 % RE OINT: 1.0000 "application " | TOPICAL_OINTMENT | RECTAL | Status: DC | PRN

## 2012-04-03 MED ORDER — ONDANSETRON HCL 4 MG PO TABS: 4.0000 mg | ORAL_TABLET | ORAL | Status: DC | PRN

## 2012-04-03 MED ORDER — LABETALOL HCL 200 MG PO TABS
200.0000 mg | ORAL_TABLET | Freq: Two times a day (BID) | ORAL | Status: DC
  Administered 2012-04-03 – 2012-04-05 (×5): 200 mg via ORAL
  Filled 2012-04-03 (×6): qty 1

## 2012-04-03 MED ORDER — TETANUS-DIPHTH-ACELL PERTUSSIS 5-2.5-18.5 LF-MCG/0.5 IM SUSP: 0.5000 mL | Freq: Once | INTRAMUSCULAR | Status: DC

## 2012-04-03 MED ORDER — ONDANSETRON HCL 4 MG/2ML IJ SOLN
INTRAMUSCULAR | Status: AC
  Filled 2012-04-03: qty 2

## 2012-04-03 MED ORDER — FENTANYL CITRATE 0.05 MG/ML IJ SOLN
INTRAMUSCULAR | Status: DC | PRN
  Administered 2012-04-03 (×9): 50 ug via INTRAVENOUS

## 2012-04-03 MED ORDER — DIPHENHYDRAMINE HCL 50 MG/ML IJ SOLN: 12.5000 mg | Freq: Four times a day (QID) | INTRAMUSCULAR | Status: DC | PRN

## 2012-04-03 MED ORDER — PHENYLEPHRINE 40 MCG/ML (10ML) SYRINGE FOR IV PUSH (FOR BLOOD PRESSURE SUPPORT)
PREFILLED_SYRINGE | INTRAVENOUS | Status: AC
  Filled 2012-04-03: qty 15

## 2012-04-03 MED ORDER — PHENYLEPHRINE 40 MCG/ML (10ML) SYRINGE FOR IV PUSH (FOR BLOOD PRESSURE SUPPORT)
80.0000 ug | PREFILLED_SYRINGE | INTRAVENOUS | Status: DC | PRN
  Filled 2012-04-03: qty 5

## 2012-04-03 MED ORDER — PROPOFOL 10 MG/ML IV EMUL
INTRAVENOUS | Status: DC | PRN
  Administered 2012-04-03: 200 mg via INTRAVENOUS

## 2012-04-03 MED ORDER — DIPHENHYDRAMINE HCL 12.5 MG/5ML PO ELIX: 12.5000 mg | ORAL_SOLUTION | Freq: Four times a day (QID) | ORAL | Status: DC | PRN

## 2012-04-03 MED ORDER — LACTATED RINGERS IV SOLN
INTRAVENOUS | Status: DC
  Administered 2012-04-03: 19:00:00 via INTRAVENOUS

## 2012-04-03 MED ORDER — IBUPROFEN 600 MG PO TABS: 600.0000 mg | ORAL_TABLET | Freq: Four times a day (QID) | ORAL | Status: DC

## 2012-04-03 MED ORDER — MIDAZOLAM HCL 5 MG/5ML IJ SOLN
INTRAMUSCULAR | Status: DC | PRN
  Administered 2012-04-03: 2 mg via INTRAVENOUS

## 2012-04-03 MED ORDER — NALOXONE HCL 0.4 MG/ML IJ SOLN: 0.4000 mg | INTRAMUSCULAR | Status: DC | PRN

## 2012-04-03 MED ORDER — SUCCINYLCHOLINE CHLORIDE 20 MG/ML IJ SOLN
INTRAMUSCULAR | Status: AC
  Filled 2012-04-03: qty 10

## 2012-04-03 MED ORDER — LANOLIN HYDROUS EX OINT: 1.0000 "application " | TOPICAL_OINTMENT | CUTANEOUS | Status: DC | PRN

## 2012-04-03 MED ORDER — OXYTOCIN 10 UNIT/ML IJ SOLN
INTRAMUSCULAR | Status: AC
  Filled 2012-04-03: qty 4

## 2012-04-03 MED ORDER — PROPOFOL 10 MG/ML IV EMUL
INTRAVENOUS | Status: AC
  Filled 2012-04-03: qty 20

## 2012-04-03 MED ORDER — LACTATED RINGERS IV SOLN
INTRAVENOUS | Status: DC | PRN
  Administered 2012-04-03 (×2): via INTRAVENOUS

## 2012-04-03 MED ORDER — EPHEDRINE 5 MG/ML INJ
INTRAVENOUS | Status: AC
  Filled 2012-04-03: qty 10

## 2012-04-03 MED ORDER — MORPHINE SULFATE 10 MG/ML IJ SOLN
INTRAMUSCULAR | Status: AC
  Filled 2012-04-03: qty 1

## 2012-04-03 MED ORDER — EPHEDRINE 5 MG/ML INJ
10.0000 mg | INTRAVENOUS | Status: DC | PRN
  Filled 2012-04-03: qty 4

## 2012-04-03 MED ORDER — LACTATED RINGERS IV SOLN
INTRAVENOUS | Status: DC
  Administered 2012-04-04: 02:00:00 via INTRAVENOUS

## 2012-04-03 MED ORDER — CEFAZOLIN SODIUM-DEXTROSE 2-3 GM-% IV SOLR
2.0000 g | Freq: Once | INTRAVENOUS | Status: AC
  Administered 2012-04-03: 2 g via INTRAVENOUS
  Filled 2012-04-03: qty 50

## 2012-04-03 MED ORDER — FAMOTIDINE 20 MG PO TABS
20.0000 mg | ORAL_TABLET | Freq: Every day | ORAL | Status: DC
  Filled 2012-04-03 (×2): qty 1

## 2012-04-03 MED ORDER — FENTANYL CITRATE 0.05 MG/ML IJ SOLN
INTRAMUSCULAR | Status: AC
  Filled 2012-04-03: qty 2

## 2012-04-03 MED ORDER — MORPHINE SULFATE 10 MG/ML IJ SOLN
INTRAMUSCULAR | Status: DC | PRN
  Administered 2012-04-03 (×2): 2 mg via INTRAVENOUS
  Administered 2012-04-03 (×2): 1 mg via INTRAVENOUS
  Administered 2012-04-03: 2 mg via INTRAVENOUS
  Administered 2012-04-03 (×2): 1 mg via INTRAVENOUS

## 2012-04-03 MED ORDER — PHENYLEPHRINE HCL 10 MG/ML IJ SOLN
INTRAMUSCULAR | Status: DC | PRN
  Administered 2012-04-03 (×5): 0.1 mg via INTRAVENOUS

## 2012-04-03 MED ORDER — FENTANYL CITRATE 0.05 MG/ML IJ SOLN
INTRAMUSCULAR | Status: AC
  Filled 2012-04-03: qty 5

## 2012-04-03 MED ORDER — KETOROLAC TROMETHAMINE 30 MG/ML IJ SOLN
15.0000 mg | Freq: Once | INTRAMUSCULAR | Status: AC | PRN
  Administered 2012-04-03: 30 mg via INTRAVENOUS

## 2012-04-03 MED ORDER — WITCH HAZEL-GLYCERIN EX PADS: 1.0000 "application " | MEDICATED_PAD | CUTANEOUS | Status: DC | PRN

## 2012-04-03 MED ORDER — HYDROMORPHONE 0.3 MG/ML IV SOLN
INTRAVENOUS | Status: DC
  Administered 2012-04-03: 23:00:00 via INTRAVENOUS
  Administered 2012-04-04: 1.39 mg via INTRAVENOUS
  Administered 2012-04-04: 0.8 mg via INTRAVENOUS
  Administered 2012-04-04: 1.39 mg via INTRAVENOUS
  Filled 2012-04-03: qty 25

## 2012-04-03 MED ORDER — DEXAMETHASONE SODIUM PHOSPHATE 10 MG/ML IJ SOLN
INTRAMUSCULAR | Status: AC
  Filled 2012-04-03: qty 1

## 2012-04-03 MED ORDER — ONDANSETRON HCL 4 MG/2ML IJ SOLN
INTRAMUSCULAR | Status: DC | PRN
  Administered 2012-04-03: 4 mg via INTRAVENOUS

## 2012-04-03 SURGICAL SUPPLY — 33 items
APL SKNCLS STERI-STRIP NONHPOA (GAUZE/BANDAGES/DRESSINGS) ×1
BENZOIN TINCTURE PRP APPL 2/3 (GAUZE/BANDAGES/DRESSINGS) ×2 IMPLANT
CHLORAPREP W/TINT 26ML (MISCELLANEOUS) ×2 IMPLANT
CLOTH BEACON ORANGE TIMEOUT ST (SAFETY) ×2 IMPLANT
CONTAINER PREFILL 10% NBF 15ML (MISCELLANEOUS) IMPLANT
ELECT REM PT RETURN 9FT ADLT (ELECTROSURGICAL) ×2
ELECTRODE REM PT RTRN 9FT ADLT (ELECTROSURGICAL) ×1 IMPLANT
EXTRACTOR VACUUM M CUP 4 TUBE (SUCTIONS) IMPLANT
GLOVE BIO SURGEON STRL SZ7.5 (GLOVE) ×4 IMPLANT
GLOVE BIOGEL PI IND STRL 7.5 (GLOVE) ×1 IMPLANT
GLOVE BIOGEL PI INDICATOR 7.5 (GLOVE) ×1
GOWN PREVENTION PLUS LG XLONG (DISPOSABLE) ×6 IMPLANT
KIT ABG SYR 3ML LUER SLIP (SYRINGE) ×2 IMPLANT
NDL HYPO 25X5/8 SAFETYGLIDE (NEEDLE) IMPLANT
NEEDLE HYPO 22GX1.5 SAFETY (NEEDLE) IMPLANT
NEEDLE HYPO 25X5/8 SAFETYGLIDE (NEEDLE) IMPLANT
NS IRRIG 1000ML POUR BTL (IV SOLUTION) ×2 IMPLANT
PACK C SECTION WH (CUSTOM PROCEDURE TRAY) ×2 IMPLANT
RETRACTOR WND ALEXIS 25 LRG (MISCELLANEOUS) ×1 IMPLANT
RTRCTR WOUND ALEXIS 25CM LRG (MISCELLANEOUS) ×2
SLEEVE SCD COMPRESS KNEE MED (MISCELLANEOUS) IMPLANT
STRIP CLOSURE SKIN 1/2X4 (GAUZE/BANDAGES/DRESSINGS) ×2 IMPLANT
SUT CHROMIC 2 0 CT 1 (SUTURE) ×2 IMPLANT
SUT MNCRL AB 3-0 PS2 27 (SUTURE) ×2 IMPLANT
SUT PLAIN 0 NONE (SUTURE) IMPLANT
SUT PLAIN 2 0 XLH (SUTURE) ×2 IMPLANT
SUT VIC AB 0 CT1 36 (SUTURE) ×2 IMPLANT
SUT VIC AB 0 CTX 36 (SUTURE) ×6
SUT VIC AB 0 CTX36XBRD ANBCTRL (SUTURE) ×3 IMPLANT
SYR CONTROL 10ML LL (SYRINGE) IMPLANT
TOWEL OR 17X24 6PK STRL BLUE (TOWEL DISPOSABLE) ×4 IMPLANT
TRAY FOLEY CATH 14FR (SET/KITS/TRAYS/PACK) ×2 IMPLANT
WATER STERILE IRR 1000ML POUR (IV SOLUTION) ×2 IMPLANT

## 2012-04-03 NOTE — Progress Notes (Signed)
Attempting to monitor fetus--pt on hands and knees-difficult to trace

## 2012-04-03 NOTE — Progress Notes (Signed)
Patient ID: Lauren Walls, female   DOB: 11/07/1970, 41 y.o.   MRN: [id number]  Pt feeling mild contractions regularly.  Not requesting pain medicine.  AF VSS VE FT/L/Posterior FHT cat 2 Toco q 1-14min  A/P Undergoing induction.  Will augment with pitocin per protocol. Fetal status is overall reassuring Htn under control

## 2012-04-03 NOTE — Anesthesia Postprocedure Evaluation (Signed)
  Anesthesia Post-op Note  Patient: Lauren Walls  Procedure(s) Performed: Procedure(s) (LRB): CESAREAN SECTION (N/A)  Patient Location: PACU  Anesthesia Type: General  Level of Consciousness: awake, alert  and oriented  Airway and Oxygen Therapy: Patient Spontanous Breathing  Post-op Pain: mild  Post-op Assessment: Post-op Vital signs reviewed, Patient's Cardiovascular Status Stable, Respiratory Function Stable, Patent Airway, No signs of Nausea or vomiting and Pain level controlled  Post-op Vital Signs: Reviewed and stable  Complications: No apparent anesthesia complications

## 2012-04-03 NOTE — Progress Notes (Signed)
MD remains at bedside

## 2012-04-03 NOTE — Progress Notes (Signed)
Patient ID: Lauren Walls, female   DOB: 1970/12/14, 41 y.o.   MRN: [id number]  RN having difficulty finding FHT while pt on left side.  Pt with oxygen on most of the day secondary to cat 2 tracing with increased variability with O2.  Pitocin decreased secondary to pt discomfort and awaiting CBC for pt to get epidural.  I attempt to get FHTs as well with difficulty.  AF VSS VE lip/0 FHT cat 2 FSE placed and FHR in 60s for a few minutes.  Position changed.  Pitocin held (was on 78mu/min), O2 already infusing and bolus given already (in anticipation of epidural).  FHTs continue to be down.  OR alerted will be coming for csection secondary to fetal bradycardia. Toco q1-28min   A/P Verbal consent for c-section obtained.  Pt taken to OR. In OR FHTs still in the 60s (obtained by fetal doppler by L&D RN).  Will proceed emergently with csection.

## 2012-04-03 NOTE — Progress Notes (Signed)
Pt off monitors to shower. MD aware.

## 2012-04-03 NOTE — Progress Notes (Signed)
MD orders to continue pitocin

## 2012-04-03 NOTE — Op Note (Addendum)
Cesarean Section Procedure Note  Indications: Pt taken emergently to OR secondary to bradycardia in the 60s with rapid cervical dilation.  FHTs still in 60s upon arrival to OR.  Pt undergoing induction secondary to [facility] on meds.  Verbal consent obtained to proceed with c-section.  Pre-operative Diagnosis: Fetal Bradycardia   Post-operative Diagnosis: Fetal Bradycardia  Procedure: CESAREAN SECTION  Surgeon: Purcell Nails, MD    Assistants: Nigel Bridgeman, CNM  Anesthesia: General  Anesthesiologist: Dana Allan, MD   Procedure Details  The patient was taken to the operating room secondary to fetal bradycardia.  The patient concurred with the proposed plan, giving informed consent which was given verbally. The patient was taken to Operating Room 1, identified as [name] and the procedure verified as C-Section Delivery.   After being placed under general anesthesia by radiologist, the patient was quickly prepped and draped in a sterile manner. A Pfannenstiel skin incision was made and carried down through the subcutaneous tissue to the underlying layer of fascia.  The fascia was incised bilaterally with the scalpel and the underlying rectus muscle was separated from the fascia with the scalpel and bluntly.  The peritoneum was identified, entered bluntly and extended manually. The utero-vesical peritoneal reflection was incised transversely.  A low transverse uterine incision was made with the scalpel and extended manually.  The infant was delivered in vertex position without difficulty.  After the umbilical cord was clamped and cut, the infant was handed to the awaiting pediatricians.  Cord blood and cord gases were obtained for evaluation.  The placenta was removed intact and appeared to be within normal limits. The uterus was cleared of all clots and debris. The uterus was boggy and there was a slight delay with administration of pitocin after delivery of the placenta secondary to  emergent nature of the case.  2gms of Ancef was administered.  The uterine incision was closed with running interlocking sutures of 0 Vicryl and a second imbricating layer was performed as well.   Bilateral tubes and ovaries appeared to be within normal limits.  Good hemostasis was noted.  Copious irrigation was performed until clear.  The peritoneum was repaired with 2-0 chromic via a running suture.  The fascia was reapproximated with a running suture of 0 Vicryl. The subcutaneous tissue was reapproximated with 3 interrupted sutures of 2-0 plain.  The skin was reapproximated with a subcuticular suture of 3-0 monocryl.  Steristrips were applied.  Instrument, sponge, and needle counts were correct prior to abdominal closure and at the conclusion of the case.  However secondary to emergent nature of case, an abdominal xray was performed and initial verbal report by radiologist was negative but something opaque was seen and thought to be external. Repeat abdominal xray was performed and verbal report by radiologist was negative.  The patient was awaiting transfer to the recovery room in good condition and waking and being extubated.  Findings: Live female infant with Apgars 8 at one minute and 9 at five minutes.  Normal appearing bilateral ovaries and fallopian tubes were noted.  Estimated Blood Loss:          Drains: foley to gravity 100cc clear urine         Total IV Fluids:         Specimens to Pathology: Placenta         Complications:  None; patient tolerated the procedure well.         Disposition: PACU - hemodynamically stable.  Condition: stable  Attending Attestation: I performed the procedure.

## 2012-04-03 NOTE — Anesthesia Preprocedure Evaluation (Signed)
Anesthesia Evaluation  Patient identified by MRN, date of birth, ID band Patient awake    Reviewed: Allergy & Precautions, NPO status   History of Anesthesia Complications Negative for: history of anesthetic complications  Airway       Dental  (+) Teeth Intact   Pulmonary neg pulmonary ROS,  breath sounds clear to auscultation        Cardiovascular hypertension, On Home Beta Blockers Rhythm:regular Rate:Normal     Neuro/Psych negative neurological ROS  negative psych ROS   GI/Hepatic Neg liver ROS, GERD-  Medicated,  Endo/Other  Morbid obesity  Renal/GU negative Renal ROS  negative genitourinary   Musculoskeletal   Abdominal   Peds  Hematology negative hematology ROS (+)   Anesthesia Other Findings STAT C/S under general anesthesia for fetal bradycardia - history performed from chart only after induction  Reproductive/Obstetrics (+) Pregnancy (fetal bradycardia - STAT C/S)                           Anesthesia Physical Anesthesia Plan  ASA: III and Emergent  Anesthesia Plan: General ETT and Modified Rapid Sequence   Post-op Pain Management:    Induction:   Airway Management Planned:   Additional Equipment:   Intra-op Plan:   Post-operative Plan:   Informed Consent: I have reviewed the patients History and Physical, chart, labs and discussed the procedure including the risks, benefits and alternatives for the proposed anesthesia with the patient or authorized representative who has indicated his/her understanding and acceptance.     Plan Discussed with: Surgeon  Anesthesia Plan Comments:         Anesthesia Quick Evaluation

## 2012-04-03 NOTE — H&P (Signed)
Lauren Walls is a 41 y.o. female presenting for induction of labor at 38w 6d due to chronic hypertension.  She reports normal fetal activity.  She has noted occas irreg UCs but denies ROM or bldg.  . Maternal Medical History:  Fetal activity: Perceived fetal activity is normal.    Prenatal Complications - Diabetes: none.   Hx present pregnancy:  Pt entered care at [redacted]wks gestation.  EDC confirmed by [place] at [redacted]wk gestation.  Pt with neg 1st trim screen and MSAFP.  Pt declined amniocentesis.  Pt underwent early 1hr glucola with normal results. [place] for anatomy completed at 18wks with limited views of the fetal heart.  Repeat US at 22wks for completion of anatomy with normal views obtained.  Pt followed with serial ultrasounds for growth and NSTs.  Pt with neg PIH labs and 24hr urine, last obtained within the last week.   OB History    Grav Para Term Preterm Abortions TAB SAB Ect Mult Living   1              Past Medical History  Diagnosis Date  . Abnormal Pap smear 05/2011    colpo  . H/O candidiasis     BV   . Hypertension   . Blood transfusion 2008  . Depression    Past Surgical History  Procedure Date  . Wisdom tooth extraction     x 4  . Tonsillectomy   . Dilation and curettage of uterus 2008   Family History: family history includes COPD in her maternal uncle; Cancer in her father, maternal aunt, maternal grandfather, and mother; Diabetes in her father; Heart disease in her father and paternal grandmother; and Hypertension in her brother, father, maternal aunt, maternal grandfather, maternal grandmother, maternal uncle, mother, paternal aunt, paternal grandfather, paternal grandmother, and paternal uncle. Social History:  reports that she has quit smoking. She has never used smokeless tobacco. She reports that she does not drink alcohol or use illicit drugs.   Prenatal Transfer Tool  Maternal Diabetes: No Genetic Screening: Normal Maternal Ultrasounds/Referrals: Normal Fetal  Ultrasounds or other Referrals:  None Maternal Substance Abuse:  No Significant Maternal Medications:  Meds include: Zantac Zoloft Other: see prenatal record labetalol 200mg  bid Significant Maternal Lab Results:  None Other Comments:  None  Review of Systems  Constitutional: Negative.   HENT: Negative.   Eyes: Negative.   Respiratory: Negative.   Cardiovascular: Negative.   Gastrointestinal: Positive for diarrhea.  Genitourinary: Negative.   Musculoskeletal: Negative.   Skin: Negative.   Neurological: Negative.   Endo/Heme/Allergies: Negative.   Psychiatric/Behavioral: Negative.     Dilation: 1 Effacement (%): Thick Station: -3 Exam by:: Chief Technology Officer Blood pressure 129/71, pulse 80, temperature 99 F (37.2 C), temperature source Oral, resp. rate 18, height 5\' 8"  (1.727 m), weight 130.636 kg (288 lb), last menstrual period 07/03/2011. Maternal Exam:  Abdomen: Patient reports no abdominal tenderness. Fundal height is 39.   Estimated fetal weight is 7#.   Fetal presentation: vertex  Introitus: Normal vulva. Normal vagina.    Fetal Exam Fetal Monitor Review: Mode: ultrasound.   Baseline rate: 150.  Variability: moderate (6-25 bpm).   Pattern: accelerations present.    Fetal State Assessment: Category I - tracings are normal.     Physical Exam  Constitutional: She is oriented to person, place, and time. She appears well-developed and well-nourished.  HENT:  Head: Normocephalic and atraumatic.  Right Ear: External ear normal.  Left Ear: External ear normal.  Nose:  Nose normal.  Eyes: Conjunctivae are normal. Pupils are equal, round, and reactive to light.  Neck: Normal range of motion. Neck supple. No thyromegaly present.  Cardiovascular: Normal rate, regular rhythm and intact distal pulses.   Respiratory: Effort normal and breath sounds normal.  GI: Soft. Bowel sounds are normal.  Genitourinary: Vagina normal and uterus normal.       Gravid, non-tender.    Musculoskeletal: Normal range of motion.  Neurological: She is alert and oriented to person, place, and time. She has normal reflexes.  Skin: Skin is warm and dry.  Psychiatric: She has a normal mood and affect. Her behavior is normal.    Prenatal labs: ABO, Rh: --/--/A NEG (04/05 1346) Antibody: NEG (04/05 1346) Rubella: Immune (10/29 0000) RPR: Nonreactive (10/29 0000)  HBsAg: Negative (10/29 0000)  HIV: Non-reactive (10/29 0000)  GBS: NEGATIVE (05/23 1745)   Assessment/Plan: IUP at 38w 6d IOL due to chronic hypertension/AMA. RBA Cytotec for cervical ripening d/w pt and pt desires to proceed.  Will begin pitocin in AM.    Montavius Subramaniam O. 04/03/2012, 12:04 AM

## 2012-04-03 NOTE — Progress Notes (Signed)
Called Smith CNM. Reported to CNM RN unable to place cytotec at 0700 due to UC pattern every 2-4. CNM aware. Plan of care will be discussed with Dr. Su Hilt and will make oncoming RN aware.

## 2012-04-03 NOTE — Transfer of Care (Signed)
Immediate Anesthesia Transfer of Care Note  Patient: Lauren Walls  Procedure(s) Performed: Procedure(s) (LRB): CESAREAN SECTION (N/A)  Patient Location: PACU  Anesthesia Type: General  Level of Consciousness: awake, alert  and oriented  Airway & Oxygen Therapy: Patient Spontanous Breathing and Patient connected to nasal cannula oxygen  Post-op Assessment: Report given to PACU RN and Post -op Vital signs reviewed and stable  Post vital signs: Reviewed and stable  Complications: No apparent anesthesia complications

## 2012-04-03 NOTE — Progress Notes (Addendum)
Patient ID: Lauren Walls, female   DOB: 1971-07-10, 41 y.o.   MRN: [id number]  Called by RN.  Pt feeling more uncomfortable.  Pt requesting epidural.  AF VSS VE 3/90/0 SROM at about 2p FHT cat 2 Toco q1-2 min  A/P Labor progressing well Consult anesthesia for epidural Fetal status overall reassuring

## 2012-04-03 NOTE — Progress Notes (Signed)
o2 remains in place

## 2012-04-04 ENCOUNTER — Encounter (HOSPITAL_COMMUNITY): Payer: Self-pay | Admitting: Obstetrics and Gynecology

## 2012-04-04 DIAGNOSIS — Z98891 History of uterine scar from previous surgery: Secondary | ICD-10-CM

## 2012-04-04 DIAGNOSIS — D649 Anemia, unspecified: Secondary | ICD-10-CM | POA: Diagnosis present

## 2012-04-04 DIAGNOSIS — IMO0002 Reserved for concepts with insufficient information to code with codable children: Secondary | ICD-10-CM | POA: Diagnosis not present

## 2012-04-04 LAB — COMPREHENSIVE METABOLIC PANEL
ALT: 12 U/L (ref 0–35)
Alkaline Phosphatase: 94 U/L (ref 39–117)
CO2: 26 mEq/L (ref 19–32)
Chloride: 104 mEq/L (ref 96–112)
GFR calc Af Amer: >90 mL/min (ref >90)
GFR calc non Af Amer: >90 mL/min (ref >90)
Glucose, Bld: 107 mg/dL — ABNORMAL HIGH (ref 70–99)
Potassium: 4.1 mEq/L (ref 3.5–5.1)
Sodium: 137 mEq/L (ref 135–145)

## 2012-04-04 LAB — CBC
Hemoglobin: 7.5 g/dL — ABNORMAL LOW (ref 12.0–15.0)
MCH: 23.8 pg — ABNORMAL LOW (ref 26.0–34.0)
RBC: 3.15 MIL/uL — ABNORMAL LOW (ref 3.87–5.11)

## 2012-04-04 MED ORDER — ONDANSETRON HCL 4 MG/2ML IJ SOLN: 4.0000 mg | Freq: Four times a day (QID) | INTRAMUSCULAR | Status: DC | PRN

## 2012-04-04 MED ORDER — NALOXONE HCL 0.4 MG/ML IJ SOLN: 0.4000 mg | INTRAMUSCULAR | Status: DC | PRN

## 2012-04-04 MED ORDER — SODIUM CHLORIDE 0.9 % IJ SOLN
9.0000 mL | INTRAMUSCULAR | Status: DC | PRN
  Administered 2012-04-04 – 2012-04-05 (×2): 3 mL via INTRAVENOUS

## 2012-04-04 MED ORDER — DIPHENHYDRAMINE HCL 12.5 MG/5ML PO ELIX
12.5000 mg | ORAL_SOLUTION | Freq: Four times a day (QID) | ORAL | Status: DC | PRN
  Filled 2012-04-04: qty 5

## 2012-04-04 MED ORDER — HYDROMORPHONE 0.3 MG/ML IV SOLN: INTRAVENOUS | Status: DC

## 2012-04-04 MED ORDER — KETOROLAC TROMETHAMINE 30 MG/ML IJ SOLN
30.0000 mg | Freq: Four times a day (QID) | INTRAMUSCULAR | Status: DC | PRN
  Administered 2012-04-04: 30 mg via INTRAVENOUS
  Filled 2012-04-04: qty 1

## 2012-04-04 MED ORDER — TRAMADOL HCL 50 MG PO TABS
50.0000 mg | ORAL_TABLET | Freq: Four times a day (QID) | ORAL | Status: DC | PRN
  Administered 2012-04-04 – 2012-04-06 (×7): 50 mg via ORAL
  Filled 2012-04-04 (×7): qty 1

## 2012-04-04 MED ORDER — DIPHENHYDRAMINE HCL 50 MG/ML IJ SOLN: 12.5000 mg | Freq: Four times a day (QID) | INTRAMUSCULAR | Status: DC | PRN

## 2012-04-04 NOTE — Anesthesia Postprocedure Evaluation (Signed)
  Anesthesia Post-op Note  Patient: Lauren Walls  Procedure(s) Performed: Procedure(s) (LRB): CESAREAN SECTION (N/A)  Patient Location: Mother/Baby  Anesthesia Type: General  Level of Consciousness: awake, alert  and oriented  Airway and Oxygen Therapy: Patient Spontanous Breathing  Post-op Pain: none  Post-op Assessment: Post-op Vital signs reviewed and Patient's Cardiovascular Status Stable  Post-op Vital Signs: Reviewed and stable  Complications: No apparent anesthesia complications

## 2012-04-04 NOTE — Addendum Note (Signed)
Addendum  created 04/04/12 1009 by Shanon Payor, CRNA   Modules edited:Notes Section

## 2012-04-04 NOTE — Progress Notes (Addendum)
Patient ID: Lauren Walls, female   DOB: 1971/09/05, 41 y.o.   MRN: [id number] Subjective: Postpartum Day 1: Cesarean Delivery Patient reports incisional pain, tolerating PO and no problems voiding. Is up in chair now.   Has voided once since foley out this am. Denies HA/N/V/RUQ pain. States she doesn't like how dilaudid is making her feel, states she can't take oral motrin due to ulcers. No flatus yet  felt some dizziness after ambulating back from BR, felt ok during orthostatic vitals BF initiated  Mood stable, bonding well   Objective: Vital signs in last 24 hours: Temp:  [97.4 F (36.3 C)-99.1 F (37.3 C)] 98.1 F (36.7 C) (06/17 0900) Pulse Rate:  [70-110] 93  (06/17 0900) Resp:  [16-22] 20  (06/17 0900) BP: (98-162)/(69-93) 108/69 mmHg (06/17 0900) SpO2:  [97 %-100 %] 100 % (06/17 0900)  Physical Exam:  General: alert and no distress Breasts: soft Heart: RRR Lungs: CTAB Abdomen: BS x4 Uterine Fundus: firm Incision: dressing CDI Lochia: appropriate DVT Evaluation: No evidence of DVT seen on physical exam. Negative Homan's sign. No significant calf/ankle edema. DTR's 2+, neg clonus   Basename 04/04/12 0515 04/03/12 1503  HGB 7.5* 10.4*  HCT 23.9* 33.4*   Results for orders placed during the hospital encounter of 04/02/12 (from the past 24 hour(s))  CBC     Status: Abnormal   Collection Time   04/03/12  3:03 PM      Component Value Range   WBC 8.0  4.0 - 10.5 K/uL   RBC 4.43  3.87 - 5.11 MIL/uL   Hemoglobin 10.4 (*) 12.0 - 15.0 g/dL   HCT 40.9 (*) 81.1 - 91.4 %   MCV 75.4 (*) 78.0 - 100.0 fL   MCH 23.5 (*) 26.0 - 34.0 pg   MCHC 31.1  30.0 - 36.0 g/dL   RDW 78.2 (*) 95.6 - 21.3 %   Platelets 177  150 - 400 K/uL  CBC     Status: Abnormal   Collection Time   04/04/12  5:15 AM      Component Value Range   WBC 10.0  4.0 - 10.5 K/uL   RBC 3.15 (*) 3.87 - 5.11 MIL/uL   Hemoglobin 7.5 (*) 12.0 - 15.0 g/dL   HCT 08.6 (*) 57.8 - 46.9 %   MCV 75.9 (*) 78.0 -  100.0 fL   MCH 23.8 (*) 26.0 - 34.0 pg   MCHC 31.4  30.0 - 36.0 g/dL   RDW 62.9 (*) 52.8 - 41.3 %   Platelets 158  150 - 400 K/uL  COMPREHENSIVE METABOLIC PANEL     Status: Abnormal   Collection Time   04/04/12  5:15 AM      Component Value Range   Sodium 137  135 - 145 mEq/L   Potassium 4.1  3.5 - 5.1 mEq/L   Chloride 104  96 - 112 mEq/L   CO2 26  19 - 32 mEq/L   Glucose, Bld 107 (*) 70 - 99 mg/dL   BUN 7  6 - 23 mg/dL   Creatinine, Ser 2.44  0.50 - 1.10 mg/dL   Calcium 8.7  8.4 - 01.0 mg/dL   Total Protein 4.6 (*) 6.0 - 8.3 g/dL   Albumin 2.1 (*) 3.5 - 5.2 g/dL   AST 68 (*) 0 - 37 U/L   ALT 12  0 - 35 U/L   Alkaline Phosphatase 94  39 - 117 U/L   Total Bilirubin 0.2 (*) 0.3 - 1.2 mg/dL  GFR calc non Af Amer >90  >90 mL/min   GFR calc Af Amer >90  >90 mL/min  LACTATE DEHYDROGENASE     Status: Abnormal   Collection Time   04/04/12  5:15 AM      Component Value Range   LDH 416 (*) 94 - 250 U/L  URIC ACID     Status: Normal   Collection Time   04/04/12  5:15 AM      Component Value Range   Uric Acid, Serum 5.3  2.4 - 7.0 mg/dL     Assessment/Plan: Status post Cesarean section. Doing well postoperatively.  CHTN - on labetalol - BP stable Anemia - hemodynamically stable PIH labs - AST elevated and LDH elevated, otherwise normal No s/s pre-eclampsia Will SL IV, ordered toradol IV x4 doses, secondary to pt intolerance to motrin Ordered tramadol PO as well Start percocet PO and DC dilaudid  Will d/w Dr Marcy Siren M 04/04/2012, 9:38 AM  Addendum: at 11:12am After D/W Dr Normand Sloop Will d/c toradol and cont w heparin Repeat PIH labs in the am   Pt denies headache, RUQ pain or epigastric pain.  She did get pain relief from the one dose of toradol BP 120/71  Pulse 97  Temp 98.1 F (36.7 C) (Oral)  Resp 20  Ht 5\' 8"  (1.727 m)  Wt 288 lb (130.636 kg)  BMI 43.79 kg/m2  SpO2 98%  LMP 07/03/2011 Physical Examination: General appearance - alert, well  appearing, and in no distress Chest - clear to auscultation, no wheezes, rales or rhonchi, symmetric air entry Heart - normal rate and regular rhythm Abdomen - soft, nontender, nondistended, no masses or organomegaly Bandage CDI Extremities - pedal edema 2 +.  2 plus DTR without clonus Pt stable.  Recheck CMP in the am D/c toradol

## 2012-04-04 NOTE — Plan of Care (Signed)
Problem: Discharge Progression Outcomes Goal: Barriers To Progression Addressed/Resolved Outcome: Progressing Dr Su Hilt ordered 24 hr urine and bolod work to check [facility] labs. Urine began at 1605 6/17. Catheter reinserted per Dr. Su Hilt order.

## 2012-04-04 NOTE — Progress Notes (Signed)

## 2012-04-05 LAB — CBC
Hemoglobin: 7.1 g/dL — ABNORMAL LOW (ref 12.0–15.0)
Platelets: 201 10*3/uL (ref 150–400)
RBC: 2.96 MIL/uL — ABNORMAL LOW (ref 3.87–5.11)
WBC: 8 10*3/uL (ref 4.0–10.5)

## 2012-04-05 LAB — COMPREHENSIVE METABOLIC PANEL
ALT: 16 U/L (ref 0–35)
AST: 81 U/L — ABNORMAL HIGH (ref 0–37)
Alkaline Phosphatase: 87 U/L (ref 39–117)
CO2: 26 mEq/L (ref 19–32)
Calcium: 8.6 mg/dL (ref 8.4–10.5)
Chloride: 104 mEq/L (ref 96–112)
GFR calc Af Amer: >90 mL/min (ref >90)
GFR calc non Af Amer: >90 mL/min (ref >90)
Glucose, Bld: 76 mg/dL (ref 70–99)
Potassium: 3.7 mEq/L (ref 3.5–5.1)
Sodium: 137 mEq/L (ref 135–145)
Total Bilirubin: 0.1 mg/dL — ABNORMAL LOW (ref 0.3–1.2)

## 2012-04-05 LAB — CREATININE CLEARANCE, URINE, 24 HOUR
Collection Interval-CRCL: 24 hours
Creatinine: 0.7 mg/dL (ref 0.50–1.10)
Urine Total Volume-CRCL: 2350 mL

## 2012-04-05 LAB — PROTEIN, URINE, 24 HOUR
Protein, 24H Urine: 306 mg/d — ABNORMAL HIGH (ref 50–100)
Urine Total Volume-UPROT: 2350 mL

## 2012-04-05 LAB — PREPARE RBC (CROSSMATCH)

## 2012-04-05 MED ORDER — DIPHENHYDRAMINE HCL 25 MG PO CAPS
25.0000 mg | ORAL_CAPSULE | Freq: Once | ORAL | Status: AC
  Administered 2012-04-05: 25 mg via ORAL

## 2012-04-05 MED ORDER — ACETAMINOPHEN 325 MG PO TABS
650.0000 mg | ORAL_TABLET | Freq: Once | ORAL | Status: AC
  Administered 2012-04-05: 325 mg via ORAL
  Filled 2012-04-05: qty 2

## 2012-04-05 NOTE — Progress Notes (Signed)
Patient ID: Lauren Walls, female   DOB: 1970/11/16, 42 y.o.   MRN: [id number] Subjective: Postpartum Day 2: Cesarean Delivery Patient reports tolerating PO and + flatus, feels all over muscle aches today, states her arms feels weak when trying to change positions in bed.   Denies HA/N/V/RUQ pain, no chest pain, SOB,  Denies syncope, but has not ambulated much  Pain well controlled with po meds, is taking percocet and tramadol Working on BF  Mood stable, bonding well   Objective: Vital signs in last 24 hours: Temp:  [98 F (36.7 C)-98.6 F (37 C)] 98.1 F (36.7 C) (06/18 0546) Pulse Rate:  [79-97] 79  (06/18 0546) Resp:  [18-20] 20  (06/18 0546) BP: (119-129)/(71-83) 122/80 mmHg (06/18 0546) SpO2:  [98 %-99 %] 99 % (06/17 1402) Output - -   Physical Exam:  General: alert, fatigued and no distress Breasts: soft  Heart: RRR Lungs: CTAB Abdomen: BS x4 Uterine Fundus: firm Incision: dressing CDI - enc pt to remove in shower today Lochia: appropriate DVT Evaluation: No evidence of DVT seen on physical exam. Negative Homan's sign. Calf/Ankle edema is present. Mild BLEE non pitting, DTR's 2+, neg clonus    Basename 04/05/12 0545 04/04/12 0515  HGB 7.1* 7.5*  HCT 22.6* 23.9*    Assessment/Plan: CHTN - on labetalol PO, VSS, will check orthostatics Anemia - hgb. Stable, briefly discussed blood tx, will consult w Dr Su Hilt Foley catheter in place for 24hour urine collection - complete at 1600 today Status post Cesarean section. Postoperative course complicated by anemia, and elevated AST   On heparin for VTE prophylaxis  Continue current care.  Mercer Peifer M 04/05/2012, 9:49 AM

## 2012-04-06 DIAGNOSIS — O1490 Unspecified pre-eclampsia, unspecified trimester: Secondary | ICD-10-CM | POA: Diagnosis present

## 2012-04-06 LAB — COMPREHENSIVE METABOLIC PANEL
ALT: 18 U/L (ref 0–35)
AST: 69 U/L — ABNORMAL HIGH (ref 0–37)
Albumin: 2.2 g/dL — ABNORMAL LOW (ref 3.5–5.2)
Alkaline Phosphatase: 83 U/L (ref 39–117)
Chloride: 104 mEq/L (ref 96–112)
Potassium: 3.6 mEq/L (ref 3.5–5.1)
Total Bilirubin: 0.3 mg/dL (ref 0.3–1.2)

## 2012-04-06 LAB — TYPE AND SCREEN
Antibody Screen: NEGATIVE
Unit division: 0

## 2012-04-06 LAB — PROTIME-INR: INR: 1.04 (ref 0.00–1.49)

## 2012-04-06 LAB — CBC
HCT: 27.6 % — ABNORMAL LOW (ref 36.0–46.0)
Platelets: 218 10*3/uL (ref 150–400)
RBC: 3.55 MIL/uL — ABNORMAL LOW (ref 3.87–5.11)
RDW: 17.3 % — ABNORMAL HIGH (ref 11.5–15.5)
WBC: 8.8 10*3/uL (ref 4.0–10.5)

## 2012-04-06 LAB — APTT: aPTT: 33 seconds (ref 24–37)

## 2012-04-06 MED ORDER — MAGNESIUM SULFATE 40 MG/ML IJ SOLN: 4.0000 g | Freq: Once | INTRAMUSCULAR | Status: DC

## 2012-04-06 MED ORDER — MAGNESIUM SULFATE 40 G IN LACTATED RINGERS - SIMPLE: 2.0000 g/h | Freq: Once | INTRAVENOUS | Status: DC

## 2012-04-06 MED ORDER — MAGNESIUM SULFATE BOLUS VIA INFUSION
4.0000 g | Freq: Once | INTRAVENOUS | Status: AC
  Administered 2012-04-06: 4 g via INTRAVENOUS
  Filled 2012-04-06: qty 500

## 2012-04-06 MED ORDER — LABETALOL HCL 200 MG PO TABS
200.0000 mg | ORAL_TABLET | Freq: Two times a day (BID) | ORAL | Status: DC
  Administered 2012-04-06 – 2012-04-07 (×3): 200 mg via ORAL
  Filled 2012-04-06 (×5): qty 1

## 2012-04-06 MED ORDER — MAGNESIUM SULFATE 40 G IN LACTATED RINGERS - SIMPLE
2.0000 g/h | INTRAVENOUS | Status: AC
  Administered 2012-04-06: 2 g/h via INTRAVENOUS
  Filled 2012-04-06 (×2): qty 500

## 2012-04-06 MED ORDER — LACTATED RINGERS IV SOLN
INTRAVENOUS | Status: DC
  Administered 2012-04-06: 50 mL/h via INTRAVENOUS

## 2012-04-06 NOTE — Progress Notes (Signed)
PROGRESS NOTE  I have reviewed the patient's vital signs, labs, and notes. I have seen the patient but did not awaken her. I agree with the previous note from the Certified Nurse Midwife. Clinical course and management reviewed with her husband.  We will plan to stop magnesium at 3:00 a.m. on 04/07/12. Hopefully, the patient can go home later tomorrow.  Leonard Schwartz, M.D.

## 2012-04-06 NOTE — Progress Notes (Addendum)
Subjective: Postpartum Day 4: Cesarean Delivery Patient reports feeling "a little hazy" this morning.  Breastfeeding.  Inpatient circ.  VB is like a "heavy period."  Denies PIH s/s.   tolerating PO, + flatus and no problems voiding.  Newborn on left breast on my arrival.  S.o. At bedside.  Magnesium sulfate started around 0300.  Objective: Vital signs in last 24 hours: Temp:  [98 F (36.7 C)-98.6 F (37 C)] 98 F (36.7 C) (06/19 0900) Pulse Rate:  [76-98] 88  (06/19 0800) Resp:  [16-20] 18  (06/19 0900) BP: (93-136)/(60-85) 135/80 mmHg (06/19 0900) SpO2:  [96 %-100 %] 98 % (06/19 0900) Weight:  [290 lb 14.4 oz (131.951 kg)] 290 lb 14.4 oz (131.951 kg) (06/19 0251) .Marland KitchenI/O last 3 completed shifts: In: 1012.8 [P.O.:570; I.V.:430.8; Blood:12] Out: 2525 [Urine:2525] Total I/O In: 150 [I.V.:150] Out: -  .. Filed Vitals:   04/06/12 0600 04/06/12 0716 04/06/12 0800 04/06/12 0900  BP: 136/82 131/73 124/74 135/80  Pulse: 83 93 88   Temp:  98.2 F (36.8 C)  98 F (36.7 C)  TempSrc:  Oral    Resp: 18 18  18   Height:      Weight:      SpO2: 99% 98% 96% 98%   Results for orders placed during the hospital encounter of 04/02/12 (from the past 24 hour(s))  PREPARE RBC (CROSSMATCH)     Status: Normal   Collection Time   04/05/12  2:00 PM      Component Value Range   Order Confirmation ORDER PROCESSED BY BLOOD BANK    TYPE AND SCREEN     Status: Normal   Collection Time   04/05/12  2:20 PM      Component Value Range   ABO/RH(D) A NEG     Antibody Screen NEG     Sample Expiration 04/08/2012     Unit Number [id number]     Blood Component Type RED CELLS,LR     Unit division 00     Status of Unit ISSUED,FINAL     Transfusion Status OK TO TRANSFUSE     Crossmatch Result Compatible     Unit Number [id number]     Blood Component Type RED CELLS,LR     Unit division 00     Status of Unit ISSUED,FINAL     Transfusion Status OK TO TRANSFUSE     Crossmatch Result Compatible    MRSA PCR  SCREENING     Status: Normal   Collection Time   04/06/12  2:00 AM      Component Value Range   MRSA by PCR NEGATIVE  NEGATIVE  CBC     Status: Abnormal   Collection Time   04/06/12  5:38 AM      Component Value Range   WBC 8.8  4.0 - 10.5 K/uL   RBC 3.55 (*) 3.87 - 5.11 MIL/uL   Hemoglobin 8.8 (*) 12.0 - 15.0 g/dL   HCT 95.2 (*) 84.1 - 32.4 %   MCV 77.7 (*) 78.0 - 100.0 fL   MCH 24.8 (*) 26.0 - 34.0 pg   MCHC 31.9  30.0 - 36.0 g/dL   RDW 40.1 (*) 02.7 - 25.3 %   Platelets 218  150 - 400 K/uL  COMPREHENSIVE METABOLIC PANEL     Status: Abnormal   Collection Time   04/06/12  5:38 AM      Component Value Range   Sodium 137  135 - 145 mEq/L   Potassium 3.6  3.5 - 5.1 mEq/L   Chloride 104  96 - 112 mEq/L   CO2 25  19 - 32 mEq/L   Glucose, Bld 77  70 - 99 mg/dL   BUN 7  6 - 23 mg/dL   Creatinine, Ser 1.61  0.50 - 1.10 mg/dL   Calcium 8.5  8.4 - 09.6 mg/dL   Total Protein 5.2 (*) 6.0 - 8.3 g/dL   Albumin 2.2 (*) 3.5 - 5.2 g/dL   AST 69 (*) 0 - 37 U/L   ALT 18  0 - 35 U/L   Alkaline Phosphatase 83  39 - 117 U/L   Total Bilirubin 0.3  0.3 - 1.2 mg/dL   GFR calc non Af Amer >90  >90 mL/min   GFR calc Af Amer >90  >90 mL/min  LACTATE DEHYDROGENASE     Status: Abnormal   Collection Time   04/06/12  5:38 AM      Component Value Range   LDH 306 (*) 94 - 250 U/L  URIC ACID     Status: Normal   Collection Time   04/06/12  5:38 AM      Component Value Range   Uric Acid, Serum 5.4  2.4 - 7.0 mg/dL  MAGNESIUM     Status: Abnormal   Collection Time   04/06/12  5:38 AM      Component Value Range   Magnesium 3.4 (*) 1.5 - 2.5 mg/dL  PROTIME-INR     Status: Normal   Collection Time   04/06/12  5:38 AM      Component Value Range   Prothrombin Time 13.8  11.6 - 15.2 seconds   INR 1.04  0.00 - 1.49  APTT     Status: Normal   Collection Time   04/06/12  5:38 AM      Component Value Range   aPTT 33  24 - 37 seconds    Physical Exam:  General: alert, cooperative, no distress and  moderately obese Lochia: appropriate, rubra Uterine Fundus: firm, at umbilicus Incision: post-op dsg c/d/i;  DVT Evaluation: No evidence of DVT seen on physical exam. Negative Homan's sign. Calf/Ankle edema is present. 2+ BLE pitting edema; no clonus; DTR's 1+ BLE   Basename 04/06/12 0538 04/05/12 0545  HGB 8.8* 7.1*  HCT 27.6* 22.6*    Assessment/Plan: Status post primary Cesarean section. CHTN now superimposed PreEclampsia.    Lactating.  Decreasing AST.  Anemia.   Continue current care.  Magnesium to continue for 24 hrs.  Adjust Labetalol as needed. Anticipate d/c tomorrow.  Jerrie Schussler H 04/06/2012, 9:52 AM

## 2012-04-06 NOTE — Progress Notes (Signed)
Patient ID: Lauren Walls, female   DOB: 1971/01/31, 41 y.o.   MRN: [id number]  Pt is postop day 3 s/p primary c/s w CHTN and now superimposed preeclampsia  S: pt states she feels better after receiving bl tx, states pain is well controlled w PO meds. has been up to BR and ambulating in room Appears appropriately worried about new results of 24hour urine protein, sig other supportive at bs, baby has been rooming in  O:  Filed Vitals:   04/05/12 1949 04/05/12 2035 04/05/12 2121 04/05/12 2225  BP: 133/85 110/72 134/83 118/80  Pulse: 97 88 86 88  Temp: 98.3 F (36.8 C) 98.4 F (36.9 C) 98.5 F (36.9 C) 98.1 F (36.7 C)  TempSrc: Oral Oral Oral Oral  Resp: 18 16 16 18   Height:      Weight:      SpO2:       24hour total urine protein =306mg    A: postop day 3  CHTN stable on labetalol Anemia - s/p bl tx   P: D/W Dr Marylee Floras Sulfate bolus 4g IV and then 2g/hour x24 hours for seizure prophylaxis tx to AICU, cont current orders Repeat PIH labs w mag level in the AM

## 2012-04-06 NOTE — Progress Notes (Signed)
UR chart review completed.  

## 2012-04-06 NOTE — Progress Notes (Signed)
MRSA PCR Swab collected & CHG bath done upon admission to AICU.

## 2012-04-07 ENCOUNTER — Encounter (HOSPITAL_COMMUNITY)
Admission: RE | Admit: 2012-04-07 | Discharge: 2012-04-07 | Disposition: A | Discharge status: Home / Self Care | Payer: 59 | Source: Ambulatory Visit | Attending: Obstetrics and Gynecology | Admitting: Obstetrics and Gynecology

## 2012-04-07 MED ORDER — HYDROCHLOROTHIAZIDE 25 MG PO TABS: 25.0000 mg | ORAL_TABLET | Freq: Every day | ORAL | Status: DC

## 2012-04-07 MED ORDER — PRENATAL MULTIVITAMIN CH: 1.0000 | ORAL_TABLET | Freq: Every day | ORAL | Status: DC

## 2012-04-07 MED ORDER — HYDROCHLOROTHIAZIDE 25 MG PO TABS
25.0000 mg | ORAL_TABLET | Freq: Every day | ORAL | Status: DC
  Administered 2012-04-07: 25 mg via ORAL
  Filled 2012-04-07 (×2): qty 1

## 2012-04-07 MED ORDER — TRAMADOL HCL 50 MG PO TABS: 50.0000 mg | ORAL_TABLET | Freq: Four times a day (QID) | ORAL | Status: AC | PRN

## 2012-04-07 MED ORDER — OXYCODONE-ACETAMINOPHEN 5-325 MG PO TABS: 1.0000 | ORAL_TABLET | ORAL | Status: AC | PRN

## 2012-04-07 NOTE — Discharge Instructions (Signed)
[facility] discharge instructions

## 2012-04-07 NOTE — Discharge Summary (Signed)
Lauren Walls is a 41 y.o. female PO C/S day 5 pre eclampsia, off MGSO$ at 0300, denies headache, visual spots or blurring, with swelling to ankles. Using percocet and ultram last at 2400. Breastfeeding well. Ready to go home  OB History as of 04/03/12    Grav Para Term Preterm Abortions TAB SAB Ect Mult Living   1 1 1 0 0 0 0 0 0 1     Past Medical History  Diagnosis Date  . Abnormal Pap smear 05/2011    colpo  . H/O candidiasis     BV   . Hypertension   . Blood transfusion 2008  . Depression    Past Surgical History  Procedure Date  . Wisdom tooth extraction     x 4  . Tonsillectomy   . Dilation and curettage of uterus 2008  . Cesarean section 04/03/2012    Procedure: CESAREAN SECTION;  Surgeon: Angela Y Roberts, MD;  Location: [facility];  Service: Gynecology;  Laterality: N/A;  Primary Cesarean Section with birth of baby boy @ 1542 apgars 8/9   Family History: family history includes COPD in her maternal uncle; Cancer in her father, maternal aunt, maternal grandfather, and mother; Diabetes in her father; Heart disease in her father and paternal grandmother; and Hypertension in her brother, father, maternal aunt, maternal grandfather, maternal grandmother, maternal uncle, mother, paternal aunt, paternal grandfather, paternal grandmother, and paternal uncle. Social History:  reports that she has quit smoking. She has never used smokeless tobacco. She reports that she does not drink alcohol or use illicit drugs. Results for orders placed during the hospital encounter of 04/02/12 (from the past 72 hour(s))  PROTEIN, URINE, 24 HOUR     Status: Abnormal   Collection Time   04/04/12  4:05 PM      Component Value Range Comment   Urine Total Volume-UPROT 2350      Collection Interval-UPROT 24      Protein, Urine 13      Protein, 24H Urine 306 (*) 50 - 100 mg/day   CREATININE CLEARANCE, URINE, 24 HOUR     Status: Abnormal   Collection Time   04/04/12  4:05 PM      Component Value  Range Comment   Urine Total Volume-CRCL 2350      Collection Interval-CRCL 24      Creatinine, Urine 92.87      Creatinine 0.70  0.50 - 1.10 mg/dL    Creatinine, 24H Ur 2182 (*) 700 - 1800 mg/day    Creatinine Clearance 217 (*) 75 - 115 mL/min   CBC     Status: Abnormal   Collection Time   04/05/12  5:45 AM      Component Value Range Comment   WBC 8.0  4.0 - 10.5 K/uL    RBC 2.96 (*) 3.87 - 5.11 MIL/uL    Hemoglobin 7.1 (*) 12.0 - 15.0 g/dL    HCT 22.6 (*) 36.0 - 46.0 %    MCV 76.4 (*) 78.0 - 100.0 fL    MCH 24.0 (*) 26.0 - 34.0 pg    MCHC 31.4  30.0 - 36.0 g/dL    RDW 17.7 (*) 11.5 - 15.5 %    Platelets 201  150 - 400 K/uL   COMPREHENSIVE METABOLIC PANEL     Status: Abnormal   Collection Time   04/05/12  5:45 AM      Component Value Range Comment   Sodium 137  135 - 145 mEq/L      Potassium 3.7  3.5 - 5.1 mEq/L    Chloride 104  96 - 112 mEq/L    CO2 26  19 - 32 mEq/L    Glucose, Bld 76  70 - 99 mg/dL    BUN 6  6 - 23 mg/dL    Creatinine, Ser 0.70  0.50 - 1.10 mg/dL    Calcium 8.6  8.4 - 10.5 mg/dL    Total Protein 5.0 (*) 6.0 - 8.3 g/dL    Albumin 2.2 (*) 3.5 - 5.2 g/dL    AST 81 (*) 0 - 37 U/L    ALT 16  0 - 35 U/L    Alkaline Phosphatase 87  39 - 117 U/L    Total Bilirubin 0.1 (*) 0.3 - 1.2 mg/dL    GFR calc non Af Amer >90  >90 mL/min    GFR calc Af Amer >90  >90 mL/min   URIC ACID     Status: Normal   Collection Time   04/05/12  5:45 AM      Component Value Range Comment   Uric Acid, Serum 5.1  2.4 - 7.0 mg/dL   LACTATE DEHYDROGENASE     Status: Abnormal   Collection Time   04/05/12  5:45 AM      Component Value Range Comment   LDH 326 (*) 94 - 250 U/L   PREPARE RBC (CROSSMATCH)     Status: Normal   Collection Time   04/05/12  2:00 PM      Component Value Range Comment   Order Confirmation ORDER PROCESSED BY BLOOD BANK     TYPE AND SCREEN     Status: Normal   Collection Time   04/05/12  2:20 PM      Component Value Range Comment   ABO/RH(D) A NEG      Antibody  Screen NEG      Sample Expiration 04/08/2012      Unit Number [id number]      Blood Component Type RED CELLS,LR      Unit division 00      Status of Unit ISSUED,FINAL      Transfusion Status OK TO TRANSFUSE      Crossmatch Result Compatible      Unit Number [id number]      Blood Component Type RED CELLS,LR      Unit division 00      Status of Unit ISSUED,FINAL      Transfusion Status OK TO TRANSFUSE      Crossmatch Result Compatible     MRSA PCR SCREENING     Status: Normal   Collection Time   04/06/12  2:00 AM      Component Value Range Comment   MRSA by PCR NEGATIVE  NEGATIVE   CBC     Status: Abnormal   Collection Time   04/06/12  5:38 AM      Component Value Range Comment   WBC 8.8  4.0 - 10.5 K/uL    RBC 3.55 (*) 3.87 - 5.11 MIL/uL    Hemoglobin 8.8 (*) 12.0 - 15.0 g/dL    HCT 27.6 (*) 36.0 - 46.0 %    MCV 77.7 (*) 78.0 - 100.0 fL    MCH 24.8 (*) 26.0 - 34.0 pg    MCHC 31.9  30.0 - 36.0 g/dL    RDW 17.3 (*) 11.5 - 15.5 %    Platelets 218  150 - 400 K/uL   COMPREHENSIVE METABOLIC PANEL     Status: Abnormal     Collection Time   04/06/12  5:38 AM      Component Value Range Comment   Sodium 137  135 - 145 mEq/L    Potassium 3.6  3.5 - 5.1 mEq/L    Chloride 104  96 - 112 mEq/L    CO2 25  19 - 32 mEq/L    Glucose, Bld 77  70 - 99 mg/dL    BUN 7  6 - 23 mg/dL    Creatinine, Ser 0.69  0.50 - 1.10 mg/dL    Calcium 8.5  8.4 - 10.5 mg/dL    Total Protein 5.2 (*) 6.0 - 8.3 g/dL    Albumin 2.2 (*) 3.5 - 5.2 g/dL    AST 69 (*) 0 - 37 U/L    ALT 18  0 - 35 U/L    Alkaline Phosphatase 83  39 - 117 U/L    Total Bilirubin 0.3  0.3 - 1.2 mg/dL    GFR calc non Af Amer >90  >90 mL/min    GFR calc Af Amer >90  >90 mL/min   LACTATE DEHYDROGENASE     Status: Abnormal   Collection Time   04/06/12  5:38 AM      Component Value Range Comment   LDH 306 (*) 94 - 250 U/L   URIC ACID     Status: Normal   Collection Time   04/06/12  5:38 AM      Component Value Range Comment   Uric Acid,  Serum 5.4  2.4 - 7.0 mg/dL   MAGNESIUM     Status: Abnormal   Collection Time   04/06/12  5:38 AM      Component Value Range Comment   Magnesium 3.4 (*) 1.5 - 2.5 mg/dL   PROTIME-INR     Status: Normal   Collection Time   04/06/12  5:38 AM      Component Value Range Comment   Prothrombin Time 13.8  11.6 - 15.2 seconds    INR 1.04  0.00 - 1.49   APTT     Status: Normal   Collection Time   04/06/12  5:38 AM      Component Value Range Comment   aPTT 33  24 - 37 seconds   06/19 0701 - 06/20 0700 In: 2500 [P.O.:1000; I.V.:1500] Out: 1900 [Urine:1900] Physical exam: Calm, no distress, HEENT wnl lungs clear bilaterally, AP RRR, abd soft, gravid, nt, bowel sounds active, abdomen nontender, uterus firm U minus 2DTR +1 Incision well approximated no redness, edema, or drainage, with steri strips.  no clonus +1 pitting R, +2 pitting L edema to lower extremities Neg Homan's sign bilaterally  Assessment/Plan: PO day 5 pre eclampsia    Discussed birth control options, undecided, reviewed s/s infection, bleeding, pain, ha, visual disturbances, dizziness, shortness of breath to report. Discussed pt status with Dr. Jacqlyn Marolf per telephone and she will come see pt.                                                                                     KREBSBACH, MARY 04/07/2012, 10:30 AM Mary Krebsbach, CNM   Pt is stable s/p Magnesium sulfate for pre-eclmpasia.  Still   has significant swelling of ankles. Will DC home with HCTZ daily and current dose of labetaLol.   Will order smart start followup for BP on 04/11/12 

## 2012-04-07 NOTE — Progress Notes (Addendum)
Lauren Walls is a 41 y.o. female PO C/S day 5 pre eclampsia, off MGSO$ at 0300, denies headache, visual spots or blurring, with swelling to ankles. Using percocet and ultram last at 2400. Breastfeeding well. Ready to go home  OB History as of 04/03/12    Grav Para Term Preterm Abortions TAB SAB Ect Mult Living   1 1 1  0 0 0 0 0 0 1     Past Medical History  Diagnosis Date  . Abnormal Pap smear 05/2011    colpo  . H/O candidiasis     BV   . Hypertension   . Blood transfusion 2008  . Depression    Past Surgical History  Procedure Date  . Wisdom tooth extraction     x 4  . Tonsillectomy   . Dilation and curettage of uterus 2008  . Cesarean section 04/03/2012    Procedure: CESAREAN SECTION;  Surgeon: Purcell Nails, MD;  Location: [facility];  Service: Gynecology;  Laterality: N/A;  Primary Cesarean Section with birth of baby boy @ 1542 apgars 8/9   Family History: family history includes COPD in her maternal uncle; Cancer in her father, maternal aunt, maternal grandfather, and mother; Diabetes in her father; Heart disease in her father and paternal grandmother; and Hypertension in her brother, father, maternal aunt, maternal grandfather, maternal grandmother, maternal uncle, mother, paternal aunt, paternal grandfather, paternal grandmother, and paternal uncle. Social History:  reports that she has quit smoking. She has never used smokeless tobacco. She reports that she does not drink alcohol or use illicit drugs. Results for orders placed during the hospital encounter of 04/02/12 (from the past 72 hour(s))  PROTEIN, URINE, 24 HOUR     Status: Abnormal   Collection Time   04/04/12  4:05 PM      Component Value Range Comment   Urine Total Volume-UPROT 2350      Collection Interval-UPROT 24      Protein, Urine 13      Protein, 24H Urine 306 (*) 50 - 100 mg/day   CREATININE CLEARANCE, URINE, 24 HOUR     Status: Abnormal   Collection Time   04/04/12  4:05 PM      Component Value  Range Comment   Urine Total Volume-CRCL 2350      Collection Interval-CRCL 24      Creatinine, Urine 92.87      Creatinine 0.70  0.50 - 1.10 mg/dL    Creatinine, 78G Ur 9562 (*) 700 - 1800 mg/day    Creatinine Clearance 217 (*) 75 - 115 mL/min   CBC     Status: Abnormal   Collection Time   04/05/12  5:45 AM      Component Value Range Comment   WBC 8.0  4.0 - 10.5 K/uL    RBC 2.96 (*) 3.87 - 5.11 MIL/uL    Hemoglobin 7.1 (*) 12.0 - 15.0 g/dL    HCT 13.0 (*) 86.5 - 46.0 %    MCV 76.4 (*) 78.0 - 100.0 fL    MCH 24.0 (*) 26.0 - 34.0 pg    MCHC 31.4  30.0 - 36.0 g/dL    RDW 78.4 (*) 69.6 - 15.5 %    Platelets 201  150 - 400 K/uL   COMPREHENSIVE METABOLIC PANEL     Status: Abnormal   Collection Time   04/05/12  5:45 AM      Component Value Range Comment   Sodium 137  135 - 145 mEq/L  Potassium 3.7  3.5 - 5.1 mEq/L    Chloride 104  96 - 112 mEq/L    CO2 26  19 - 32 mEq/L    Glucose, Bld 76  70 - 99 mg/dL    BUN 6  6 - 23 mg/dL    Creatinine, Ser 8.29  0.50 - 1.10 mg/dL    Calcium 8.6  8.4 - 56.2 mg/dL    Total Protein 5.0 (*) 6.0 - 8.3 g/dL    Albumin 2.2 (*) 3.5 - 5.2 g/dL    AST 81 (*) 0 - 37 U/L    ALT 16  0 - 35 U/L    Alkaline Phosphatase 87  39 - 117 U/L    Total Bilirubin 0.1 (*) 0.3 - 1.2 mg/dL    GFR calc non Af Amer >90  >90 mL/min    GFR calc Af Amer >90  >90 mL/min   URIC ACID     Status: Normal   Collection Time   04/05/12  5:45 AM      Component Value Range Comment   Uric Acid, Serum 5.1  2.4 - 7.0 mg/dL   LACTATE DEHYDROGENASE     Status: Abnormal   Collection Time   04/05/12  5:45 AM      Component Value Range Comment   LDH 326 (*) 94 - 250 U/L   PREPARE RBC (CROSSMATCH)     Status: Normal   Collection Time   04/05/12  2:00 PM      Component Value Range Comment   Order Confirmation ORDER PROCESSED BY BLOOD BANK     TYPE AND SCREEN     Status: Normal   Collection Time   04/05/12  2:20 PM      Component Value Range Comment   ABO/RH(D) A NEG      Antibody  Screen NEG      Sample Expiration 04/08/2012      Unit Number [id number]      Blood Component Type RED CELLS,LR      Unit division 00      Status of Unit ISSUED,FINAL      Transfusion Status OK TO TRANSFUSE      Crossmatch Result Compatible      Unit Number [id number]      Blood Component Type RED CELLS,LR      Unit division 00      Status of Unit ISSUED,FINAL      Transfusion Status OK TO TRANSFUSE      Crossmatch Result Compatible     MRSA PCR SCREENING     Status: Normal   Collection Time   04/06/12  2:00 AM      Component Value Range Comment   MRSA by PCR NEGATIVE  NEGATIVE   CBC     Status: Abnormal   Collection Time   04/06/12  5:38 AM      Component Value Range Comment   WBC 8.8  4.0 - 10.5 K/uL    RBC 3.55 (*) 3.87 - 5.11 MIL/uL    Hemoglobin 8.8 (*) 12.0 - 15.0 g/dL    HCT 13.2 (*) 44.0 - 46.0 %    MCV 77.7 (*) 78.0 - 100.0 fL    MCH 24.8 (*) 26.0 - 34.0 pg    MCHC 31.9  30.0 - 36.0 g/dL    RDW 10.2 (*) 72.5 - 15.5 %    Platelets 218  150 - 400 K/uL   COMPREHENSIVE METABOLIC PANEL     Status: Abnormal  Collection Time   04/06/12  5:38 AM      Component Value Range Comment   Sodium 137  135 - 145 mEq/L    Potassium 3.6  3.5 - 5.1 mEq/L    Chloride 104  96 - 112 mEq/L    CO2 25  19 - 32 mEq/L    Glucose, Bld 77  70 - 99 mg/dL    BUN 7  6 - 23 mg/dL    Creatinine, Ser 1.61  0.50 - 1.10 mg/dL    Calcium 8.5  8.4 - 09.6 mg/dL    Total Protein 5.2 (*) 6.0 - 8.3 g/dL    Albumin 2.2 (*) 3.5 - 5.2 g/dL    AST 69 (*) 0 - 37 U/L    ALT 18  0 - 35 U/L    Alkaline Phosphatase 83  39 - 117 U/L    Total Bilirubin 0.3  0.3 - 1.2 mg/dL    GFR calc non Af Amer >90  >90 mL/min    GFR calc Af Amer >90  >90 mL/min   LACTATE DEHYDROGENASE     Status: Abnormal   Collection Time   04/06/12  5:38 AM      Component Value Range Comment   LDH 306 (*) 94 - 250 U/L   URIC ACID     Status: Normal   Collection Time   04/06/12  5:38 AM      Component Value Range Comment   Uric Acid,  Serum 5.4  2.4 - 7.0 mg/dL   MAGNESIUM     Status: Abnormal   Collection Time   04/06/12  5:38 AM      Component Value Range Comment   Magnesium 3.4 (*) 1.5 - 2.5 mg/dL   PROTIME-INR     Status: Normal   Collection Time   04/06/12  5:38 AM      Component Value Range Comment   Prothrombin Time 13.8  11.6 - 15.2 seconds    INR 1.04  0.00 - 1.49   APTT     Status: Normal   Collection Time   04/06/12  5:38 AM      Component Value Range Comment   aPTT 33  24 - 37 seconds   06/19 0701 - 06/20 0700 In: 2500 [P.O.:1000; I.V.:1500] Out: 1900 [Urine:1900] Physical exam: Calm, no distress, HEENT wnl lungs clear bilaterally, AP RRR, abd soft, gravid, nt, bowel sounds active, abdomen nontender, uterus firm U minus 2DTR +1 Incision well approximated no redness, edema, or drainage, with steri strips.  no clonus +1 pitting R, +2 pitting L edema to lower extremities Neg Homan's sign bilaterally  Assessment/Plan: PO day 5 pre eclampsia    Discussed birth control options, undecided, reviewed s/s infection, bleeding, pain, ha, visual disturbances, dizziness, shortness of breath to report. Discussed pt status with Dr. Pennie Rushing per telephone and she will come see pt.                                                                                     Walls, Lauren 04/07/2012, 10:30 AM Lavera Guise, CNM   Pt is stable s/p Magnesium sulfate for pre-eclmpasia.  Still  has significant swelling of ankles. Will DC home with HCTZ daily and current dose of labetaLol.   Will order smart start followup for BP on 04/11/12

## 2012-04-07 NOTE — Progress Notes (Signed)
Patient given discharge instructions and prescription, questions answered. Ambulated downstairs to be discharged with baby stopping by nursery to have Hugs tag removed.

## 2012-04-11 ENCOUNTER — Ambulatory Visit (HOSPITAL_COMMUNITY)
Admit: 2012-04-11 | Discharge: 2012-04-11 | Payer: 59 | Attending: Obstetrics and Gynecology | Admitting: Obstetrics and Gynecology

## 2012-04-11 ENCOUNTER — Telehealth: Payer: Self-pay | Admitting: Obstetrics and Gynecology

## 2012-04-11 NOTE — Telephone Encounter (Signed)
TC from [employer] nurse from [employer]. .  Pt's BP 140/80.   HCTZ completed.  Taking Labetalol but not sure of dosage.  No PIH SX.  Tc to pt to obtain dosage.   LM to return call.  Per [facility] D/C note, pt to continue Labetalol as [prescribed which was 200 mg BID>VL informed of report.   No further F/U needed until PP if pt doing well.

## 2012-04-11 NOTE — Progress Notes (Addendum)
Adult Lactation Consultation Outpatient Visit Note Infant Linford Arnold Consult scheduled on discharge for poor feeding and weight loss.   Patient Name: Lauren Walls Date of Birth: [birth year] Gestational Age at Delivery: Unknown Type of Delivery: C-Section  Breastfeeding History: Frequency of Breastfeeding: every 3-4 hrs Length of Feeding: bottle feeding only Voids: 9 Stools: 3 yellow seedy  Supplementing / Method: Pumping:  Type of Pump: Symphony   Frequency: every 3 hrs during the day only   Volume:  5-10 ml  Comments: Mothers states infant went home with 11% weight loss from hospital.  Mother had PPHem and was given 2 units of blood, Hem drop from 10.4 down to 7.1 piror to transfusion. Mother states she does have a history of HTN and is on Labetalol 200 mg Bid.  Mother states that her nipples became very sore with cracks and scabs. She was unable to tolerate breast feeding infant. No noted cracks observed, only old scab on (L) nipple. Mothers breast are very soft. When hand expressing only a few drops expressed on tip of nipple. Mother states that she has not used SNS since hospital discharge. Infant has been bottle feeding with formula. Mother has been giving 45-60 ml each feeding.   Consultation Evaluation: Attempt to latch infant to breast with #24 nipple shield . Mother has concerns that latching will cause pain again. Infant latched well. Sat up feeding tube syringe and gave infant 10 ml . Observed feeding for 10 mins. Infant only transferred 10 ml. Placed infant on bare breast and gave 2ml with curved tip syringe and observed infant with good sustained latch for another 10 mins. Infant transferred only 2 ml. Infant was given another 30 ml with feeding tube syringe while feeding on (L) breast. Infant sustained good deep latch for 15 mins. Infant transferred 32 ml from feeding with 2ml being from mother. Father gave infant another 15 ml of formula from bottle with slow flow  nipple.   Mother given lots of tips on properly latching infant. Instruct mother  In using  football hold and cross cradle hold. Mother informed of importance of rotating positions and infant sustaining good depth for entire feeding. Mother inst in breast compression during feeding.  Initial Feeding Assessment: Pre-feed RUEAVW:0981 Post-feed XBJYNW:2956 Amount Transferred:59ml Comments:  Additional Feeding Assessment: Pre-feed Weight:3092 Post-feed OZHYQM:5784 Amount Transferred:64ml Comments:  Additional Feeding Assessment: Pre-feed ONGEXB:2841 Post-feed LKGMWN:0272 Amount Transferred:32ml Comments:  Total Breast milk Transferred this Visit: 2 ml Total Supplement Given: 44 ml given with Syringe , 15 ml given with bottle Total feeding : 59 ml Additional Interventions: (1) inst to do good breast massage for 5 mins and then to lean over to shake breast well for several mins before starting to pump. (2) begin pumping every 2-3 hrs for 20 mins. inst mother to pump at night with only 5 hrs between pumping at night.  (3)Mother inst to offer breast early and often with all feeding cues and to wake infant every 2-3 hrs. inst mother to do lots of skin to skin with infant.  (4)Mother to breastfeed infant for at least 20 mins and offer second breast., prefer using SNS but bottle if nesseccary. (5) suggested that mother use SNS at the breast if she can do this by herself. Bottle may be used . Encouraged to use SNS several times daily.  Recommended follow up with Lactation consultant in 1 week.  Follow-Up  July 1 at 1:00    [facility] 04/11/2012, 11:39 AM

## 2012-04-12 ENCOUNTER — Telehealth: Payer: Self-pay | Admitting: Obstetrics and Gynecology

## 2012-04-12 NOTE — Telephone Encounter (Signed)
TC to pt. LM to return call.  

## 2012-04-15 ENCOUNTER — Telehealth: Payer: Self-pay

## 2012-04-18 ENCOUNTER — Encounter: Payer: 59 | Admitting: Obstetrics and Gynecology

## 2012-04-18 ENCOUNTER — Ambulatory Visit (HOSPITAL_COMMUNITY)
Admission: RE | Admit: 2012-04-18 | Discharge: 2012-04-18 | Disposition: A | Discharge status: Home / Self Care | Payer: 59 | Source: Ambulatory Visit | Attending: Obstetrics and Gynecology | Admitting: Obstetrics and Gynecology

## 2012-04-18 NOTE — Progress Notes (Signed)
Adult Lactation Consultation Outpatient Visit Note  Patient Name: Lauren Walls Date of Birth: 1971-05-29 Gestational Age at Delivery: Unknown Type of Delivery: C/section 6/16 ,Per mom AICU for MagSO4, PIH, Chronic elevated B/P, in the hospital was placed on ( HCTZ for 2-3 days but per mom did not take it has long as prescribed out of concern for the baby and her milk supply.          Per mom is still on Labetolol , also has loss 30 pounds fluid since D/C.   Breastfeeding History: Frequency of Breastfeeding: only pumping ( see below ) ,  and feeding from a bottle due to the SNS being to much to handle  Length of Feeding:  Voids: 8-22ml  Stools: 4 yellowish   Supplementing / Method: Supplementing - every 2- 3hours with formula  Pumping:  Type of Pump:DEBP Medela    Frequency: 4-5X's per day   Volume:  Drops to 5 or less ( the best volume 10 ml   Comments:    Consultation Evaluation: LAST FEEDING PRIOR to Consult- @12n  60 ml of formula in a bottle per mom.  LC assessment _ Noted infant to have a white coating on tongue from front to back . And bumpy diaper rash. Encouraged mom to call Dr. Cardell Walls for thrush tx . Mom at this point has no S/S's of yeast. Encouraged mom to be on the alert if she is re-latching ,and also may need tx . ( Yeast tx guidelines given to mom) .                                             Discussed with the mom the effects of PIH and chronic elevated B/p , and B/P meds on milk supply and stress. Mom mentioned her mom had died 3 days prior to delivery, Also the HCTZ prescribed in the hospital and excessive swelling.                                 Stressed to mom to try to focus on what she is giving her baby ( lots love and bonding ) and not to focus on what she is providing right now. Mom receptive to discussion.   Initial Feeding Assessment:    Left breast  Pre-feed Weight:7.7,6oz, ( 3390g)  Post-feed Weight:7.7.6oz ( 3390g)  Amount Transferred: 0 ( zero)    Comments: Infant awake , rooting , mom independent with latch in cross cradle , ( LC checked depth, and flipped upper lip open to allow for the mouth to cover more surface area), Prior to latch had mom do breast massage , hand express , milk noted ( few drops ( mom surprised) , and during feedings reminded mom to use breast compressions, increased swallowing noted. Even though the post weight did not show it.   Additional Feeding Assessment: Right breast  With moms permission added the Starter SNS with latch  Pre-feed Weight: 7.7.6 oz , ( 3390g)  Post-feed Weight:7.7.8oz ( 3398g)  Amount Transferred: 8 ml  Comments: mom independent with latch in football position with SNS . Infant tol well and stayed ina consistent pattern for 15-20 mins with multiply swallows and gulps. ( per mom was very impressed with "Lauren Walls " efforts.    Total Breast milk Transferred this Visit:  ZERO Total Supplement Given:18 ml supplementing SNS at the breast   Additional Interventions: Lactation Plan of Care - 1) rest  2) Drink plenty fluids , nutritious snacks 3) Feed every 2-3 hours 4) Consider fenugreek ( hand out given to mom ) 5) Skin to skin feedings 5) Prior to feeding  at the breast set SNS , and follow basic steps reviewed at consult 6) Combo - LC recommended using SNS at the breast when feeling rested otherwise bottle for supplementing . 7) If not latching make sure Lauren Walls is fed every 3hrs ( 8 X's per day , and increase the volume as needed . 8) Extra Pumping -  If Lauren Walls is fed a bottle for the feeding - pump for 15-20 mins , also power pumping once a day ( as discussed ). Pump at least once at night.    Follow-Up Call Dr. Dillard Walls office about the thrush for tx .       Lauren Walls 04/18/2012, 6:57 PM

## 2012-05-09 ENCOUNTER — Other Ambulatory Visit: Payer: Self-pay | Admitting: Obstetrics and Gynecology

## 2012-05-09 DIAGNOSIS — Z331 Pregnant state, incidental: Secondary | ICD-10-CM

## 2012-05-16 ENCOUNTER — Encounter: Payer: Self-pay | Admitting: Obstetrics and Gynecology

## 2012-05-16 ENCOUNTER — Ambulatory Visit (INDEPENDENT_AMBULATORY_CARE_PROVIDER_SITE_OTHER): Payer: 59 | Admitting: Obstetrics and Gynecology

## 2012-05-16 VITALS — BP 122/90 | Temp 98.2°F | Resp 16 | Ht 68.0 in | Wt 250.0 lb

## 2012-05-16 DIAGNOSIS — N949 Unspecified condition associated with female genital organs and menstrual cycle: Secondary | ICD-10-CM

## 2012-05-16 DIAGNOSIS — A499 Bacterial infection, unspecified: Secondary | ICD-10-CM

## 2012-05-16 DIAGNOSIS — N898 Other specified noninflammatory disorders of vagina: Secondary | ICD-10-CM

## 2012-05-16 DIAGNOSIS — R87612 Low grade squamous intraepithelial lesion on cytologic smear of cervix (LGSIL): Secondary | ICD-10-CM

## 2012-05-16 DIAGNOSIS — N760 Acute vaginitis: Secondary | ICD-10-CM

## 2012-05-16 DIAGNOSIS — B9689 Other specified bacterial agents as the cause of diseases classified elsewhere: Secondary | ICD-10-CM

## 2012-05-16 LAB — POCT WET PREP (WET MOUNT)

## 2012-05-16 MED ORDER — TINIDAZOLE 500 MG PO TABS: 2.0000 g | ORAL_TABLET | Freq: Every day | ORAL | Status: AC

## 2012-05-16 NOTE — Progress Notes (Signed)
Date of delivery: 04/03/2012 Female Name: Lauren Walls Vaginal delivery:no Cesarean section:yes Tubal ligation:no GDM:no Breast Feeding:yes Bottle Feeding:yes Post-Partum Blues:yes Abnormal pap:yes Normal GU function: yes Normal GI function:yes Returning to work:out another week until after visit with therapist  EPDS = 14  Denies SI or HI.  Does not feel emotionally ready to go back to work and has appt with psychiatrist (Dr. Nolen Mu) on Thurs.  ROS: noncontributory  Pelvic exam:  VULVA: normal appearing vulva with no masses, tenderness or lesions,  VAGINA: normal appearing vagina with normal color and discharge, no lesions, CERVIX: normal appearing cervix without discharge or lesions,  UTERUS: uterus is normal size, shape, consistency and nontender,  ADNEXA: normal adnexa in size, nontender and no masses. 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

## 2012-05-17 ENCOUNTER — Telehealth: Payer: Self-pay

## 2012-05-17 DIAGNOSIS — E559 Vitamin D deficiency, unspecified: Secondary | ICD-10-CM

## 2012-05-17 NOTE — Telephone Encounter (Signed)
Pt returned call regarding Vit-d level being done, pt was scheduled for tomorrow @ 2:00pm. Mathis Bud

## 2012-05-17 NOTE — Telephone Encounter (Signed)
Left message for pt to return call regarding lab work needed for Vit-d level. Lauren Walls

## 2012-05-18 ENCOUNTER — Other Ambulatory Visit: Payer: 59

## 2012-05-18 LAB — PAP IG W/ RFLX HPV ASCU

## 2012-06-02 NOTE — Progress Notes (Signed)
Quick Note:  Please schedule colposcopy. Thanks ______

## 2012-06-06 ENCOUNTER — Telehealth: Payer: Self-pay

## 2012-06-06 NOTE — Telephone Encounter (Signed)
Pt was scheduled for Colpo 06/16/2012 @ 11:30 am per Dr. Su Hilt. Mathis Bud

## 2012-06-07 ENCOUNTER — Telehealth: Payer: Self-pay

## 2012-06-16 ENCOUNTER — Encounter: Payer: 59 | Admitting: Obstetrics and Gynecology

## 2012-06-19 IMAGING — CR DG ABD PORTABLE 1V
1 series · 1 of 1 positions shown · non-contrast
Comparison: 04/03/2012, [DATE] hours.

CLINICAL DATA: C-section.

PORTABLE ABDOMEN - 1 VIEW

[view not recorded]
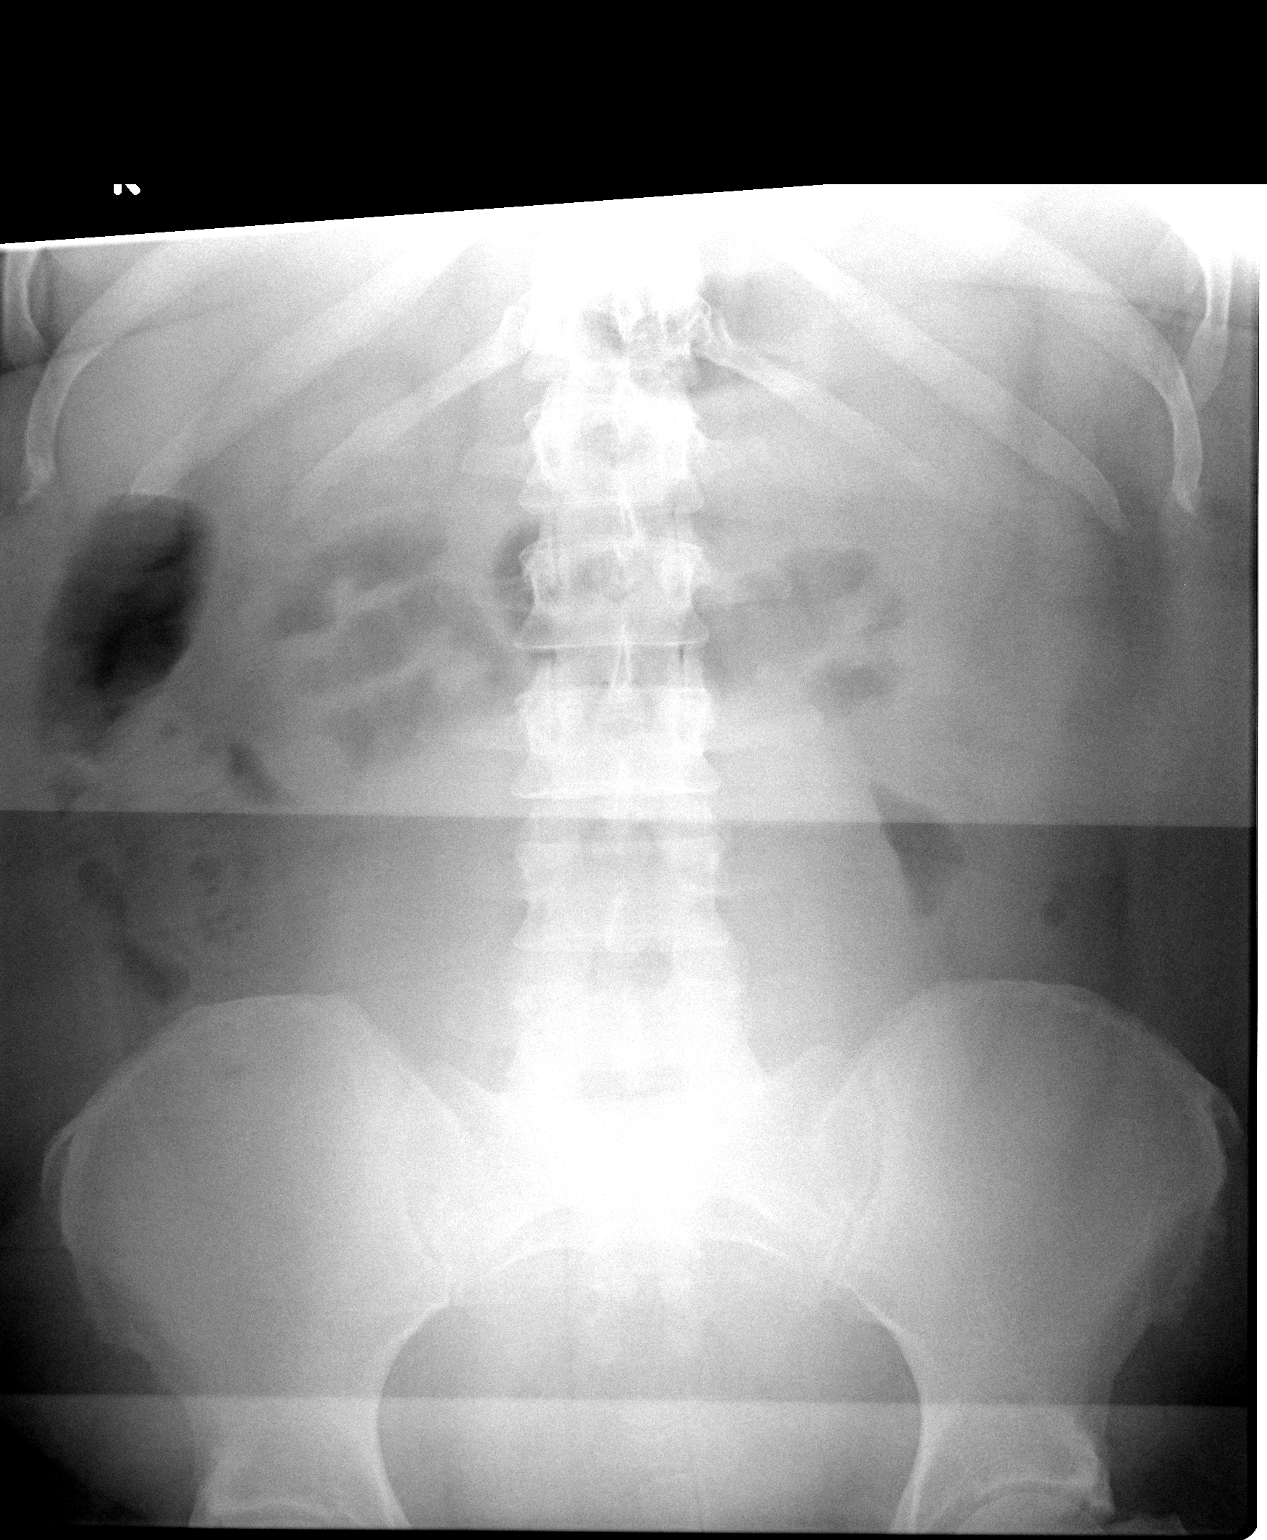

[1 of 1 positions shown; findings below may reference images not displayed]

FINDINGS: No radiopaque foreign body is identified.  Visualized
bowel gas pattern normal.
IMPRESSION: No retained radiopaque foreign body identified.

## 2012-06-19 IMAGING — CR DG ABDOMEN 1V
1 series · 1 of 1 positions shown · non-contrast
Comparison: No priors.

CLINICAL DATA: Evaluate for retained foreign body.  Status post
emergency C-section.

ABDOMEN - 1 VIEW

[view not recorded]
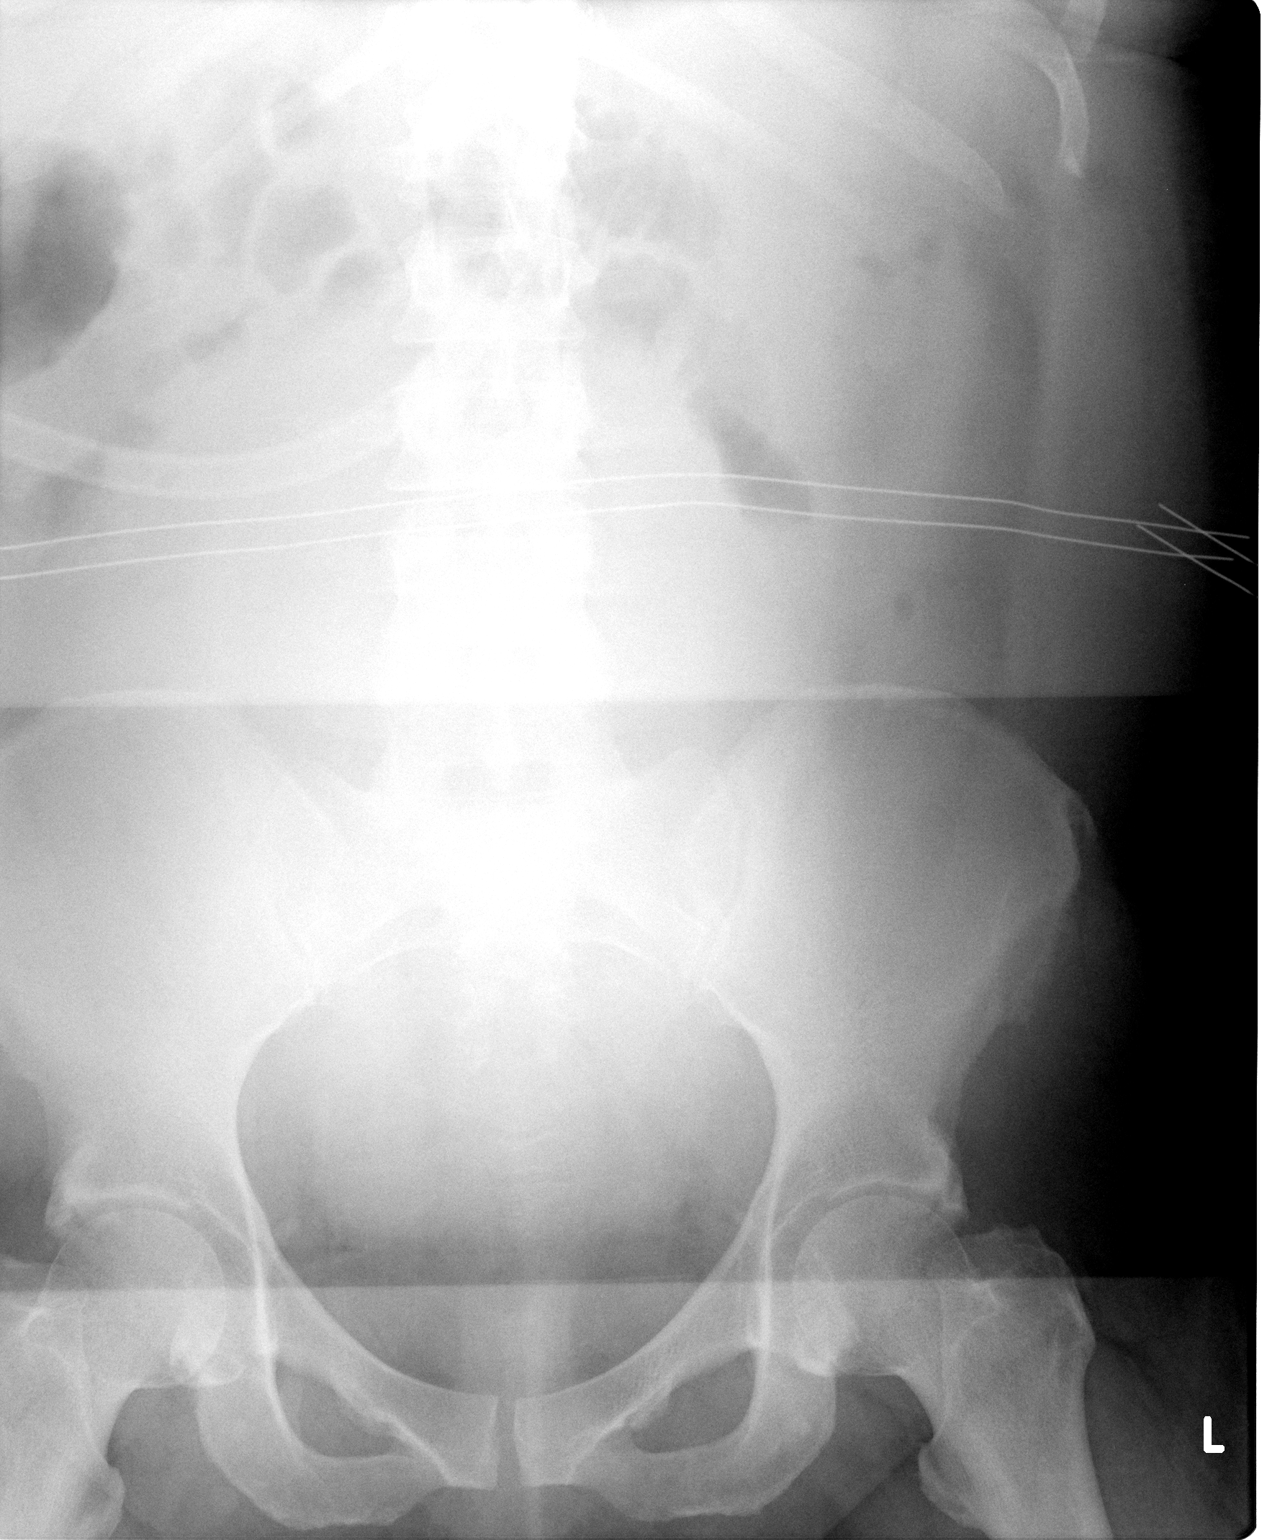

[1 of 1 positions shown; findings below may reference images not displayed]

FINDINGS: There is a radiopaque structure over the right upper
quadrant of the abdomen, favored to represent support tubing
exterior to the patient.  No definite retained radiopaque foreign
body is identified.  There is a paucity of central bowel gas,
presumably secondary to the gravid uterus.
IMPRESSION: 1.  No unexpected radiopaque foreign body.

## 2012-07-28 ENCOUNTER — Telehealth: Payer: Self-pay

## 2012-07-28 NOTE — Telephone Encounter (Signed)
Per AR, Tindamax 4 tabs po daily(2,000) x 2 days # 8 w/ 0 RF's called to [facility] at [address]. Melody Comas A

## 2012-09-01 NOTE — Telephone Encounter (Signed)
Note to close encounter only. Lauren Walls, Jacqueline A  

## 2013-01-02 ENCOUNTER — Other Ambulatory Visit: Payer: Self-pay | Admitting: Family Medicine

## 2013-01-02 DIAGNOSIS — E010 Iodine-deficiency related diffuse (endemic) goiter: Secondary | ICD-10-CM

## 2013-01-19 ENCOUNTER — Other Ambulatory Visit: Payer: Self-pay | Admitting: Obstetrics and Gynecology

## 2013-01-24 ENCOUNTER — Other Ambulatory Visit: Payer: 59

## 2014-08-20 ENCOUNTER — Encounter: Payer: Self-pay | Admitting: Obstetrics and Gynecology

## 2016-08-06 ENCOUNTER — Other Ambulatory Visit: Payer: Self-pay | Admitting: Obstetrics and Gynecology

## 2016-08-06 DIAGNOSIS — N6320 Unspecified lump in the left breast, unspecified quadrant: Secondary | ICD-10-CM

## 2016-08-17 ENCOUNTER — Other Ambulatory Visit: Payer: Self-pay | Admitting: Obstetrics and Gynecology

## 2016-08-27 ENCOUNTER — Other Ambulatory Visit: Payer: Self-pay

## 2019-06-06 ENCOUNTER — Other Ambulatory Visit: Payer: Self-pay | Admitting: Obstetrics and Gynecology

## 2019-06-22 ENCOUNTER — Telehealth: Payer: Self-pay | Admitting: Hematology

## 2019-06-22 NOTE — Telephone Encounter (Signed)
Received a new hem referral from Dr. Mancel Bale at [facility] for anemia, Ms. Fulop has been cld and scheduled to see Dr. Maylon Peppers on 9/30 at 9am w/labs at 69am. Pt has been made aware to arrive 15 minutes early.

## 2019-06-27 ENCOUNTER — Telehealth: Payer: Self-pay | Admitting: Physician Assistant

## 2019-06-27 DIAGNOSIS — R0981 Nasal congestion: Secondary | ICD-10-CM

## 2019-06-27 DIAGNOSIS — R05 Cough: Secondary | ICD-10-CM

## 2019-06-27 DIAGNOSIS — R059 Cough, unspecified: Secondary | ICD-10-CM

## 2019-06-27 MED ORDER — FLUTICASONE PROPIONATE 50 MCG/ACT NA SUSP: 2.0000 | Freq: Every day | NASAL | 0 refills | Status: DC

## 2019-06-27 MED ORDER — BENZONATATE 100 MG PO CAPS: 100.0000 mg | ORAL_CAPSULE | Freq: Three times a day (TID) | ORAL | 0 refills | Status: DC | PRN

## 2019-06-27 NOTE — Progress Notes (Signed)
E-Visit for Corona Virus Screening   Your current symptoms could be consistent with the coronavirus.  Many health care providers can now test patients at their office but not all are.  [place] has multiple testing sites. For information on our COVID testing locations and hours go to [email or web address]    You can also call  [phone] with any questions relating to COVID testing.   Please quarantine yourself while awaiting your test results.  We are enrolling you in our MyChart Home Mont[facility] for COVID19 . Daily you will receive a questionnaire within the MyChart website. Our COVID 19 response team willl be monitoriing your responses daily.  COVID-19 is a respiratory illness with symptoms that are similar to the flu. Symptoms are typically mild to moderate, but there have been cases of severe illness and death due to the virus. The following symptoms may appear 2-14 days after exposure: . Fever . Cough . Shortness of breath or difficulty breathing . Chills . Repeated shaking with chills . Muscle pain . Headache . Sore throat . New loss of taste or smell . Fatigue . Congestion or runny nose . Nausea or vomiting . Diarrhea  It is vitally important that if you feel that you have an infection such as this virus or any other virus that you stay home and away from places where you may spread it to others.  You should self-quarantine for 14 days if you have symptoms that could potentially be coronavirus or have been in close contact a with a person diagnosed with COVID-19 within the last 2 weeks. You should avoid contact with people age 48 and older.   You should wear a mask or cloth face covering over your nose and mouth if you must be around other people or animals, including pets (even at home). Try to stay at least 6 feet away from other people. This will protect the people around you.  You can use medication such as A prescription cough medication called  Tessalon Perles 100 mg. You may take 1-2 capsules every 8 hours as needed for cough. You can also use fluticasone nasal spray 2 sprays in each nostril daily for congestion. You may find it helpful to use a humidifier and nasal saline spray as well. [facility].   You may also take acetaminophen (Tylenol) as needed for fever/headache/muscle aches.   Reduce your risk of any infection by using the same precautions used for avoiding the common cold or flu:  Marland Kitchen. Wash your hands often with soap and warm water for at least 20 seconds.  If soap and water are not readily available, use an alcohol-based hand sanitizer with at least 60% alcohol.  . If coughing or sneezing, cover your mouth and nose by coughing or sneezing into the elbow areas of your shirt or coat, into a tissue or into your sleeve (not your hands). . Avoid shaking hands with others and consider head nods or verbal greetings only. . Avoid touching your eyes, nose, or mouth with unwashed hands.  . Avoid close contact with people who are sick. . Avoid places or events with large numbers of people in one location, like concerts or sporting events. . Carefully consider travel plans you have or are making. . If you are planning any travel outside or inside the [place], visit the CDC's Travelers' Health webpage for the latest health notices. . If you have some symptoms but not all symptoms, continue to monitor at  home and seek medical attention if your symptoms worsen. . If you are having a medical emergency, call 911.  HOME CARE . Only take medications as instructed by your medical team. . Drink plenty of fluids and get plenty of rest. . A steam or ultrasonic humidifier can help if you have congestion.   GET HELP RIGHT AWAY IF YOU HAVE EMERGENCY WARNING SIGNS** FOR COVID-19. If you or someone is showing any of these signs seek emergency medical care immediately. Call 911 or proceed to your closest  emergency facility if: . You develop worsening high fever. . Trouble breathing . Bluish lips or face . Persistent pain or pressure in the chest . New confusion . Inability to wake or stay awake . You cough up blood. . Your symptoms become more severe  **This list is not all possible symptoms. Contact your medical provider for any symptoms that are sever or concerning to you.   MAKE SURE YOU   Understand these instructions.  Will watch your condition.  Will get help right away if you are not doing well or get worse.  Your e-visit answers were reviewed by a board certified advanced clinical practitioner to complete your personal care plan.  Depending on the condition, your plan could have included both over the counter or prescription medications.  If there is a problem please reply once you have received a response from your provider.  Your safety is important to [place].  If you have drug allergies check your prescription carefully.    You can use MyChart to ask questions about today's visit, request a non-urgent call back, or ask for a work or school excuse for 24 hours related to this e-Visit. If it has been greater than 24 hours you will need to follow up with your provider, or enter a new e-Visit to address those concerns. You will get an e-mail in the next two days asking about your experience.  I hope that your e-visit has been valuable and will speed your recovery. Thank you for using e-visits.   Greater than 5 minutes, yet less than 10 minutes of time have been spent researching, coordinating, and implementing care for this patient today.

## 2019-06-28 ENCOUNTER — Other Ambulatory Visit: Payer: Self-pay

## 2019-06-28 DIAGNOSIS — Z20822 Contact with and (suspected) exposure to covid-19: Secondary | ICD-10-CM

## 2019-06-29 LAB — NOVEL CORONAVIRUS, NAA: SARS-CoV-2, NAA: NOT DETECTED

## 2019-07-17 ENCOUNTER — Other Ambulatory Visit: Payer: Self-pay | Admitting: Hematology

## 2019-07-17 DIAGNOSIS — N920 Excessive and frequent menstruation with regular cycle: Secondary | ICD-10-CM | POA: Insufficient documentation

## 2019-07-17 DIAGNOSIS — D500 Iron deficiency anemia secondary to blood loss (chronic): Secondary | ICD-10-CM

## 2019-07-17 NOTE — Progress Notes (Addendum)
[facility] CONSULT NOTE  Patient Care Team: Osborn Cohooberts, Angela, MD as PCP - General (Obstetrics and Gynecology)  HEME/ONC OVERVIEW: 1. Likely iron deficiency anemia secondary to menorrhagia   ASSESSMENT & PLAN:   Likely iron deficiency anemia secondary to menorrhagia  -I reviewed the patient's records in detail, including external gynecology clinic notes -In summary, patient has a history of longstanding menorrhagia, for which she is currently taking OCP.  Her gynecologist was concerned about the symptoms related to anemia, and referred the patient to hematology for consideration of IV iron.  However, despite extensive review of the patient's external records, there was no documented hemoglobin or iron study in the chart. -I discussed the results in detail with the patient -I have ordered CBC amd completed nutritional panel, including iron profile -We discussed some of the risks, benefits, and alternatives of intravenous iron infusions.  -The patient is symptomatic from anemia.  If the iron level is confirmed low, she would need IV iron to higher levels of iron faster for adequate hematopoiesis.  -Some of the side-effects to be expected including risks of infusion reactions, phlebitis, headaches, nausea and fatigue.   -The patient is willing to proceed.  Due to her insurance coverage, she will need Venofer weekly x 4.  -She does not yet know when she would like to receive IV iron due to scheduling conflicts, and will contact the clinic with some potential dates for treatment once she is able to review her schedule. -Goal is to keep ferritin level greater than 50. 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 menorrhagia), she may continue to experience periodic anemia requiring IV iron infusion -Continue follow-up with gynecology  Hx of bleeding ulcer -Patient reports hx of bleeding ulcer disease, for which she underwent EGD by Dr. Loreta AveMann in 2011 -Clinically, patient denies any symptoms of GI bleeding, such as abdominal pain, hematemesis, coffee-ground emesis, hematochezia, or melena -In light of her history of GI bleeding, I recommended the patient to contact her gastroenterologist to discuss the benefits vs. risks of work-up to rule out recurrent GI bleeding  No orders of the defined types were placed in this encounter.  All questions were answered. The patient knows to call the clinic with any problems, questions or concerns.  Return in 4 months for labs and clinic follow-up.  Arthur HolmsYan Bengie Kaucher, MD 07/19/2019 9:16 AM   CHIEF COMPLAINTS/PURPOSE OF CONSULTATION:  "I am just tired"  HISTORY OF PRESENTING ILLNESS:  [name] 48 y.o. female is here because of iron deficiency anemia secondary to menorrhagia.  Patient reports that she has had a longstanding menorrhagia, for which she is currently taking oral contraceptive.  Over the past 6 months, her menstrual cycle has become lighter.  She tried oral iron supplement in the past, but cannot tolerate it due to GI upset.  She reports fatigue and craving for ice.  She eats a regular diet.  She also reports history of ulcer disease, for which she underwent EGD by Dr. Loreta AveMann in 2011, but she has not had any symptoms suggestive of recurrent bleeding ulcer disease.  She has had not colonoscopy in the past.  She denies any other complaint today.  REVIEW OF SYSTEMS:  Constitutional: ( - ) fevers, ( - )  chills , ( - ) night sweats Eyes: ( - ) blurriness of vision, ( - ) double vision, ( - ) watery eyes Ears, nose, mouth, throat, and face: ( - ) mucositis, ( - ) sore throat Respiratory: ( - )  cough, ( - ) dyspnea, ( - ) wheezes Cardiovascular: ( - ) palpitation, ( - ) chest discomfort, ( - ) lower extremity swelling Gastrointestinal:  ( - ) nausea, ( - ) heartburn, ( - ) change in bowel habits Skin: ( - ) abnormal skin rashes Lymphatics: ( - ) new lymphadenopathy, ( - ) easy bruising Neurological: ( - ) numbness, ( - ) tingling, ( - ) new weaknesses Behavioral/Psych: ( - ) mood change, ( - ) new changes  All other systems were reviewed with the patient and are negative.  MEDICAL HISTORY:  Past Medical History:  Diagnosis Date  . Abnormal Pap smear 05/2011   colpo  . Blood transfusion 2008  . Depression   . H/O candidiasis    BV   . Hypertension     SURGICAL HISTORY: Past Surgical History:  Procedure Laterality Date  . CESAREAN SECTION  04/03/2012   Procedure: CESAREAN SECTION;  Surgeon: Purcell Nails, MD;  Location: [facility];  Service: Gynecology;  Laterality: N/A;  Primary Cesarean Section with birth of baby boy @ 1542 apgars 8/9  . DILATION AND CURETTAGE OF UTERUS  2008  . TONSILLECTOMY    . WISDOM TOOTH EXTRACTION     x 4    SOCIAL HISTORY: Social History   Socioeconomic History  . Marital status: Married    Spouse name: Not on file  . Number of children: Not on file  . Years of education: Not on file  . Highest education level: Not on file  Occupational History  . Not on file  Social Needs  . Financial resource strain: Not on file  . Food insecurity    Worry: Not on file    Inability: Not on file  . Transportation needs    Medical: Not on file    Non-medical: Not on file  Tobacco Use  . Smoking status: Former Games developer  . Smokeless tobacco: Never Used  Substance and Sexual Activity  . Alcohol use: No  . Drug use: No  . Sexual activity: Never  Lifestyle  . Physical activity    Days per week: Not on file    Minutes per session: Not on file  . Stress: Not on file  Relationships  . Social Musician on phone: Not on file    Gets  together: Not on file    Attends religious service: Not on file    Active member of club or organization: Not on file    Attends meetings of clubs or organizations: Not on file    Relationship status: Not on file  . Intimate partner violence    Fear of current or ex partner: Not on file    Emotionally abused: Not on file    Physically abused: Not on file    Forced sexual activity: Not on file  Other Topics Concern  . Not on file  Social History Narrative  . Not on file    FAMILY HISTORY: Family History  Problem Relation Age of Onset  . Hypertension Mother   . Cancer Mother   . Heart disease Father   . Hypertension Father   . Diabetes Father   .  Cancer Father        prostate  . Hypertension Brother   . Cancer Maternal Aunt        lung ca  . Hypertension Maternal Aunt   . COPD Maternal Uncle        emphysema  . Hypertension Maternal Uncle   . Hypertension Paternal Aunt   . Hypertension Paternal Uncle   . Hypertension Maternal Grandmother   . Cancer Maternal Grandfather        lung  . Hypertension Maternal Grandfather   . Heart disease Paternal Grandmother        CHF  . Hypertension Paternal Grandmother   . Hypertension Paternal Grandfather     ALLERGIES:  has No Known Allergies.  MEDICATIONS:  Current Outpatient Medications  Medication Sig Dispense Refill  . cetirizine (ZYRTEC) 10 MG tablet Take 10 mg by mouth as needed for allergies.    . Cholecalciferol (VITAMIN D3) 1.25 MG (50000 UT) CAPS Take 1 capsule by mouth once a week. X 8 weeks    . desvenlafaxine (PRISTIQ) 50 MG 24 hr tablet Take 50 mg by mouth daily.    Nyoka Cowden Tea, Camellia sinensis, (GREEN TEA EXTRACT PO) Take 1 tablet by mouth daily. With Red yeast rice    . triamcinolone (NASACORT) 55 MCG/ACT AERO nasal inhaler Place 2 sprays into the nose as needed. 2 sprays each nostril PRN     No current facility-administered medications for this visit.     PHYSICAL EXAMINATION: ECOG PERFORMANCE STATUS: 1 -  Symptomatic but completely ambulatory  Vitals:   07/19/19 0830  BP: 130/80  Pulse: 96  Resp: 18  Temp: 99.3 F (37.4 C)  SpO2: 100%   Filed Weights   07/19/19 0830  Weight: 254 lb 1.6 oz (115.3 kg)    GENERAL: alert, no distress and comfortable SKIN: skin color, texture, turgor are normal, no rashes or significant lesions EYES: conjunctiva are pink and non-injected, sclera clear OROPHARYNX: no exudate, no erythema; lips, buccal mucosa, and tongue normal  NECK: supple, non-tender LUNGS: clear to auscultation with normal breathing effort HEART: regular rate & rhythm, no murmurs, no lower extremity edema ABDOMEN: soft, non-tender, non-distended, normal bowel sounds Musculoskeletal: no cyanosis of digits and no clubbing  PSYCH: alert & oriented x 3, fluent speech NEURO: no focal motor/sensory deficits  LABORATORY DATA:  I have reviewed the data as listed Lab Results  Component Value Date   WBC 8.8 04/06/2012   HGB 8.8 (L) 04/06/2012   HCT 27.6 (L) 04/06/2012   MCV 77.7 (L) 04/06/2012   PLT 218 04/06/2012   Lab Results  Component Value Date   NA 137 04/06/2012   K 3.6 04/06/2012   CL 104 04/06/2012   CO2 25 04/06/2012    RADIOGRAPHIC STUDIES: I have personally reviewed the radiological images as listed and agreed with the findings in the report.  PATHOLOGY: I have reviewed the pathology reports as documented in the oncologist history.

## 2019-07-19 ENCOUNTER — Other Ambulatory Visit: Payer: Self-pay

## 2019-07-19 ENCOUNTER — Encounter: Payer: Self-pay | Admitting: Hematology

## 2019-07-19 ENCOUNTER — Telehealth: Payer: Self-pay | Admitting: Hematology

## 2019-07-19 ENCOUNTER — Inpatient Hospital Stay: Payer: 59 | Attending: Hematology | Admitting: Hematology

## 2019-07-19 ENCOUNTER — Inpatient Hospital Stay: Payer: 59

## 2019-07-19 VITALS — BP 130/80 | HR 96 | Temp 99.3°F | Resp 18 | Ht 68.0 in | Wt 254.1 lb

## 2019-07-19 DIAGNOSIS — Z8711 Personal history of peptic ulcer disease: Secondary | ICD-10-CM | POA: Insufficient documentation

## 2019-07-19 DIAGNOSIS — R5383 Other fatigue: Secondary | ICD-10-CM | POA: Insufficient documentation

## 2019-07-19 DIAGNOSIS — N924 Excessive bleeding in the premenopausal period: Secondary | ICD-10-CM

## 2019-07-19 DIAGNOSIS — Z8042 Family history of malignant neoplasm of prostate: Secondary | ICD-10-CM | POA: Diagnosis not present

## 2019-07-19 DIAGNOSIS — Z87891 Personal history of nicotine dependence: Secondary | ICD-10-CM | POA: Diagnosis not present

## 2019-07-19 DIAGNOSIS — D649 Anemia, unspecified: Secondary | ICD-10-CM | POA: Insufficient documentation

## 2019-07-19 DIAGNOSIS — D500 Iron deficiency anemia secondary to blood loss (chronic): Secondary | ICD-10-CM

## 2019-07-19 DIAGNOSIS — N950 Postmenopausal bleeding: Secondary | ICD-10-CM | POA: Diagnosis not present

## 2019-07-19 DIAGNOSIS — Z801 Family history of malignant neoplasm of trachea, bronchus and lung: Secondary | ICD-10-CM

## 2019-07-19 DIAGNOSIS — N920 Excessive and frequent menstruation with regular cycle: Secondary | ICD-10-CM | POA: Diagnosis not present

## 2019-07-19 LAB — CMP (CANCER CENTER ONLY)
ALT: 8 U/L (ref 0–44)
AST: 10 U/L — ABNORMAL LOW (ref 15–41)
Albumin: 3.8 g/dL (ref 3.5–5.0)
Alkaline Phosphatase: 94 U/L (ref 38–126)
Anion gap: 8 (ref 5–15)
BUN: 8 mg/dL (ref 6–20)
CO2: 25 mmol/L (ref 22–32)
Calcium: 9.3 mg/dL (ref 8.9–10.3)
Chloride: 108 mmol/L (ref 98–111)
Creatinine: 0.83 mg/dL (ref 0.44–1.00)
GFR, Est AFR Am: >60 mL/min (ref >60)
GFR, Estimated: >60 mL/min (ref >60)
Glucose, Bld: 97 mg/dL (ref 70–99)
Potassium: 4.1 mmol/L (ref 3.5–5.1)
Sodium: 141 mmol/L (ref 135–145)
Total Bilirubin: <0.2 mg/dL — ABNORMAL LOW (ref 0.3–1.2)
Total Protein: 7.3 g/dL (ref 6.5–8.1)

## 2019-07-19 LAB — CBC WITH DIFFERENTIAL (CANCER CENTER ONLY)
Abs Immature Granulocytes: 0.01 10*3/uL (ref 0.00–0.07)
Basophils Absolute: 0 10*3/uL (ref 0.0–0.1)
Basophils Relative: 0 %
Eosinophils Absolute: 0.1 10*3/uL (ref 0.0–0.5)
Eosinophils Relative: 1 %
HCT: 32.5 % — ABNORMAL LOW (ref 36.0–46.0)
Hemoglobin: 9.1 g/dL — ABNORMAL LOW (ref 12.0–15.0)
Immature Granulocytes: 0 %
Lymphocytes Relative: 29 %
Lymphs Abs: 1.5 10*3/uL (ref 0.7–4.0)
MCH: 19.4 pg — ABNORMAL LOW (ref 26.0–34.0)
MCHC: 28 g/dL — ABNORMAL LOW (ref 30.0–36.0)
MCV: 69.1 fL — ABNORMAL LOW (ref 80.0–100.0)
Monocytes Absolute: 0.3 10*3/uL (ref 0.1–1.0)
Monocytes Relative: 6 %
Neutro Abs: 3.3 10*3/uL (ref 1.7–7.7)
Neutrophils Relative %: 64 %
Platelet Count: 344 10*3/uL (ref 150–400)
RBC: 4.7 MIL/uL (ref 3.87–5.11)
RDW: 19.8 % — ABNORMAL HIGH (ref 11.5–15.5)
WBC Count: 5.2 10*3/uL (ref 4.0–10.5)
nRBC: 0 % (ref 0.0–0.2)

## 2019-07-19 LAB — VITAMIN B12: Vitamin B-12: 229 pg/mL (ref 180–914)

## 2019-07-19 LAB — FERRITIN: Ferritin: <4 ng/mL — ABNORMAL LOW (ref 11–307)

## 2019-07-19 LAB — IRON AND TIBC
Iron: 18 ug/dL — ABNORMAL LOW (ref 41–142)
Saturation Ratios: 4 % — ABNORMAL LOW (ref 21–57)
TIBC: 416 ug/dL (ref 236–444)
UIBC: 399 ug/dL — ABNORMAL HIGH (ref 120–384)

## 2019-07-19 LAB — FOLATE: Folate: 9.1 ng/mL (ref >5.9)

## 2019-07-19 LAB — SAVE SMEAR (SSMR)

## 2019-07-19 NOTE — Telephone Encounter (Signed)
Scheduled per 09/30 per los, patient received after visit summary and calender.

## 2019-07-19 NOTE — Addendum Note (Signed)
Addended by: Tish Men on: 07/19/2019 09:26 AM   Modules accepted: Orders

## 2019-07-24 ENCOUNTER — Telehealth: Payer: Self-pay | Admitting: *Deleted

## 2019-07-24 ENCOUNTER — Telehealth: Payer: Self-pay | Admitting: Hematology

## 2019-07-24 NOTE — Telephone Encounter (Signed)
Received call from patient inquiring about her Iron infusion appts and her lab results from 07/19/19. Reviewed labs with her and advised that Dr. Maylon Peppers would want her to get iron infusions. Per Dr. Lorette Ang note, her insurance allows Venofer, not Madilyn Hook. Venofer requires 4 weekly visits. Pt is asking if we can double check with her insurance to confirm which iron preparation is allowed. Staff message sent to Rudolpho Sevin to confirm which iron preparation [employer] covers. Will call pt after information obtained to schedule infusions. Pt voiced understanding

## 2019-07-24 NOTE — Telephone Encounter (Signed)
Scheduled appt per 10/5 sch message  - pt aware of appt date and time  

## 2019-07-24 NOTE — Telephone Encounter (Signed)
Thank you for letting me know. I believe Venofer is the preferred treatment for her insurance. Once we confirm it, can we check with the patient and see when she would like to schedule the treatment? The order for IV iron is already entered.  Thank you.  Dr. Maylon Peppers

## 2019-07-24 NOTE — Telephone Encounter (Signed)
Received answer from managed care regarding pt's insurance coverage. [employer] does not cover the V[place], but does cover the Venofer. TCT patient to advise her of the above. Pt states that Monday mornings are best for her, as early as possible. Scheduling message sent. Advised pt to expect a call from scheduling dept this week

## 2019-07-25 ENCOUNTER — Telehealth: Payer: Self-pay | Admitting: *Deleted

## 2019-07-25 NOTE — Telephone Encounter (Signed)
Thank you for your help.  Dr. Makeya Hilgert 

## 2019-07-25 NOTE — Telephone Encounter (Signed)
Notified that labs indicate iron deficiency anemia. Should resolve with iron repletion

## 2019-07-31 ENCOUNTER — Other Ambulatory Visit: Payer: Self-pay

## 2019-07-31 ENCOUNTER — Inpatient Hospital Stay: Payer: 59 | Attending: Hematology

## 2019-07-31 VITALS — BP 141/99 | HR 77 | Temp 98.4°F | Resp 16

## 2019-07-31 DIAGNOSIS — D509 Iron deficiency anemia, unspecified: Secondary | ICD-10-CM | POA: Diagnosis not present

## 2019-07-31 DIAGNOSIS — Z23 Encounter for immunization: Secondary | ICD-10-CM | POA: Diagnosis not present

## 2019-07-31 DIAGNOSIS — D500 Iron deficiency anemia secondary to blood loss (chronic): Secondary | ICD-10-CM

## 2019-07-31 MED ORDER — SODIUM CHLORIDE 0.9 % IV SOLN
200.0000 mg | Freq: Once | INTRAVENOUS | Status: AC
  Administered 2019-07-31: 08:00:00 200 mg via INTRAVENOUS
  Filled 2019-07-31: qty 10

## 2019-07-31 MED ORDER — SODIUM CHLORIDE 0.9 % IV SOLN
Freq: Once | INTRAVENOUS | Status: AC
  Administered 2019-07-31: 08:00:00 via INTRAVENOUS
  Filled 2019-07-31: qty 250

## 2019-07-31 NOTE — Patient Instructions (Signed)

## 2019-08-07 ENCOUNTER — Ambulatory Visit: Payer: 59

## 2019-08-07 ENCOUNTER — Other Ambulatory Visit: Payer: Self-pay

## 2019-08-07 ENCOUNTER — Inpatient Hospital Stay: Payer: 59

## 2019-08-07 VITALS — BP 140/91 | HR 82 | Temp 98.4°F | Resp 18

## 2019-08-07 DIAGNOSIS — Z0000 Encounter for general adult medical examination without abnormal findings: Secondary | ICD-10-CM

## 2019-08-07 DIAGNOSIS — D509 Iron deficiency anemia, unspecified: Secondary | ICD-10-CM | POA: Diagnosis not present

## 2019-08-07 DIAGNOSIS — D500 Iron deficiency anemia secondary to blood loss (chronic): Secondary | ICD-10-CM

## 2019-08-07 MED ORDER — INFLUENZA VAC SPLIT QUAD 0.5 ML IM SUSY
0.5000 mL | PREFILLED_SYRINGE | Freq: Once | INTRAMUSCULAR | Status: AC
  Administered 2019-08-07: 08:00:00 0.5 mL via INTRAMUSCULAR

## 2019-08-07 MED ORDER — SODIUM CHLORIDE 0.9 % IV SOLN
Freq: Once | INTRAVENOUS | Status: AC
  Administered 2019-08-07: 08:00:00 via INTRAVENOUS
  Filled 2019-08-07: qty 250

## 2019-08-07 MED ORDER — INFLUENZA VAC SPLIT QUAD 0.5 ML IM SUSY
PREFILLED_SYRINGE | INTRAMUSCULAR | Status: AC
  Filled 2019-08-07: qty 0.5

## 2019-08-07 MED ORDER — SODIUM CHLORIDE 0.9 % IV SOLN
200.0000 mg | Freq: Once | INTRAVENOUS | Status: AC
  Administered 2019-08-07: 09:00:00 200 mg via INTRAVENOUS
  Filled 2019-08-07: qty 10

## 2019-08-07 NOTE — Patient Instructions (Signed)

## 2019-08-14 ENCOUNTER — Inpatient Hospital Stay: Payer: 59

## 2019-08-14 ENCOUNTER — Other Ambulatory Visit: Payer: Self-pay

## 2019-08-14 VITALS — BP 153/99 | HR 70 | Temp 99.2°F | Resp 18

## 2019-08-14 DIAGNOSIS — D509 Iron deficiency anemia, unspecified: Secondary | ICD-10-CM | POA: Diagnosis not present

## 2019-08-14 DIAGNOSIS — D500 Iron deficiency anemia secondary to blood loss (chronic): Secondary | ICD-10-CM

## 2019-08-14 MED ORDER — SODIUM CHLORIDE 0.9 % IV SOLN
Freq: Once | INTRAVENOUS | Status: AC
  Administered 2019-08-14: 08:00:00 via INTRAVENOUS
  Filled 2019-08-14: qty 250

## 2019-08-14 MED ORDER — SODIUM CHLORIDE 0.9 % IV SOLN
200.0000 mg | Freq: Once | INTRAVENOUS | Status: AC
  Administered 2019-08-14: 09:00:00 200 mg via INTRAVENOUS
  Filled 2019-08-14: qty 10

## 2019-08-14 NOTE — Patient Instructions (Signed)

## 2019-08-14 NOTE — Progress Notes (Signed)
Patient declined to stay for the full 30 minute post observation period. Patient stayed for 15 minutes. VSS, patient asymptomatic upon leaving unit. Patient given AVS and informed to seek medical attention if she starts to have symptoms of a reaction. Patient verbalized understanding.

## 2019-08-21 ENCOUNTER — Ambulatory Visit: Payer: 59

## 2019-08-21 ENCOUNTER — Other Ambulatory Visit: Payer: Self-pay

## 2019-08-21 ENCOUNTER — Inpatient Hospital Stay: Payer: 59 | Attending: Hematology

## 2019-08-21 VITALS — BP 145/99 | HR 66 | Temp 99.3°F | Resp 17

## 2019-08-21 DIAGNOSIS — D500 Iron deficiency anemia secondary to blood loss (chronic): Secondary | ICD-10-CM

## 2019-08-21 DIAGNOSIS — D509 Iron deficiency anemia, unspecified: Secondary | ICD-10-CM | POA: Diagnosis not present

## 2019-08-21 MED ORDER — SODIUM CHLORIDE 0.9 % IV SOLN
Freq: Once | INTRAVENOUS | Status: AC
  Administered 2019-08-21: 08:00:00 via INTRAVENOUS
  Filled 2019-08-21: qty 250

## 2019-08-21 MED ORDER — SODIUM CHLORIDE 0.9 % IV SOLN
200.0000 mg | Freq: Once | INTRAVENOUS | Status: AC
  Administered 2019-08-21: 200 mg via INTRAVENOUS
  Filled 2019-08-21: qty 10

## 2019-08-21 NOTE — Patient Instructions (Signed)

## 2019-09-20 ENCOUNTER — Telehealth: Payer: Self-pay | Admitting: *Deleted

## 2019-09-20 NOTE — Telephone Encounter (Signed)
Pt called stating she had completed a series of 4 iron infusion - last dose 08/21/19.  Wanted to know how long pt can see the effectiveness of the infusions;  Stated she is still feeling tired, still not much energy but able to do ADLs. Next appt with Dr. Maylon Peppers is in Feb, 2021.  Pt wanted to know if she should wait that long.   Dr. Maylon Peppers notified.  Pt can come in for labs only next week;  MD will call pt with results. Attempted to call pt without answer.  Left message requesting a call back to nurse. Pt's   Phone   [phone].

## 2019-09-20 NOTE — Telephone Encounter (Signed)
Thanks for the update.  Dr. Hyun Marsalis 

## 2019-09-21 NOTE — Telephone Encounter (Signed)
Thank you for calling the patient.  Dr. Maylon Peppers

## 2019-09-21 NOTE — Telephone Encounter (Signed)
Spoke with pt today, and explained to pt that it will take at least a month or maybe longer to see the effectiveness of iron infusions.  Offered pt to come in next week for labs, and MD can call pt with results.  Pt declined.  Stated she would like to wait until January to see how she feels, and will call office back if she decides to come in for labs.

## 2019-11-22 ENCOUNTER — Encounter: Payer: Self-pay | Admitting: Hematology

## 2019-11-22 ENCOUNTER — Other Ambulatory Visit: Payer: Self-pay

## 2019-11-22 ENCOUNTER — Inpatient Hospital Stay: Payer: 59 | Admitting: Hematology

## 2019-11-22 ENCOUNTER — Inpatient Hospital Stay: Payer: 59 | Attending: Hematology

## 2019-11-22 VITALS — BP 130/96 | HR 84 | Temp 97.3°F | Resp 18 | Ht 68.0 in | Wt 252.3 lb

## 2019-11-22 DIAGNOSIS — N920 Excessive and frequent menstruation with regular cycle: Secondary | ICD-10-CM | POA: Diagnosis not present

## 2019-11-22 DIAGNOSIS — N924 Excessive bleeding in the premenopausal period: Secondary | ICD-10-CM

## 2019-11-22 DIAGNOSIS — D500 Iron deficiency anemia secondary to blood loss (chronic): Secondary | ICD-10-CM

## 2019-11-22 LAB — CMP (CANCER CENTER ONLY)
ALT: 13 U/L (ref 0–44)
AST: 12 U/L — ABNORMAL LOW (ref 15–41)
Albumin: 3.6 g/dL (ref 3.5–5.0)
Alkaline Phosphatase: 83 U/L (ref 38–126)
Anion gap: 9 (ref 5–15)
BUN: 9 mg/dL (ref 6–20)
CO2: 25 mmol/L (ref 22–32)
Calcium: 9 mg/dL (ref 8.9–10.3)
Chloride: 108 mmol/L (ref 98–111)
Creatinine: 0.76 mg/dL (ref 0.44–1.00)
GFR, Est AFR Am: >60 mL/min (ref >60)
GFR, Estimated: >60 mL/min (ref >60)
Glucose, Bld: 88 mg/dL (ref 70–99)
Potassium: 3.9 mmol/L (ref 3.5–5.1)
Sodium: 142 mmol/L (ref 135–145)
Total Bilirubin: 0.4 mg/dL (ref 0.3–1.2)
Total Protein: 6.9 g/dL (ref 6.5–8.1)

## 2019-11-22 LAB — CBC WITH DIFFERENTIAL (CANCER CENTER ONLY)
Abs Immature Granulocytes: 0.01 10*3/uL (ref 0.00–0.07)
Basophils Absolute: 0 10*3/uL (ref 0.0–0.1)
Basophils Relative: 0 %
Eosinophils Absolute: 0.1 10*3/uL (ref 0.0–0.5)
Eosinophils Relative: 1 %
HCT: 35.1 % — ABNORMAL LOW (ref 36.0–46.0)
Hemoglobin: 10.7 g/dL — ABNORMAL LOW (ref 12.0–15.0)
Immature Granulocytes: 0 %
Lymphocytes Relative: 32 %
Lymphs Abs: 1.8 10*3/uL (ref 0.7–4.0)
MCH: 23.5 pg — ABNORMAL LOW (ref 26.0–34.0)
MCHC: 30.5 g/dL (ref 30.0–36.0)
MCV: 77 fL — ABNORMAL LOW (ref 80.0–100.0)
Monocytes Absolute: 0.4 10*3/uL (ref 0.1–1.0)
Monocytes Relative: 6 %
Neutro Abs: 3.5 10*3/uL (ref 1.7–7.7)
Neutrophils Relative %: 61 %
Platelet Count: 299 10*3/uL (ref 150–400)
RBC: 4.56 MIL/uL (ref 3.87–5.11)
RDW: 16.8 % — ABNORMAL HIGH (ref 11.5–15.5)
WBC Count: 5.8 10*3/uL (ref 4.0–10.5)
nRBC: 0 % (ref 0.0–0.2)

## 2019-11-22 LAB — IRON AND TIBC
Iron: 40 ug/dL — ABNORMAL LOW (ref 41–142)
Saturation Ratios: 12 % — ABNORMAL LOW (ref 21–57)
TIBC: 326 ug/dL (ref 236–444)
UIBC: 286 ug/dL (ref 120–384)

## 2019-11-22 LAB — FERRITIN: Ferritin: 13 ng/mL (ref 11–307)

## 2019-11-22 LAB — SAVE SMEAR(SSMR), FOR PROVIDER SLIDE REVIEW

## 2019-11-22 NOTE — Progress Notes (Signed)
[facility] OFFICE PROGRESS NOTE  Patient Care Team: Patient, No Pcp Per as PCP - General (General Practice)  HEME/ONC OVERVIEW: 1. Iron deficiency anemia secondary to menorrhagia   TREATMENT SUMMARY:  PRN IV iron (Venofer), last in early 08/2019   ASSESSMENT & PLAN:   Iron deficiency anemia secondary to menorrhagia  -Secondary to menorrhagia  -S/p IV iron, last in early 08/2019 -Hgb 10.7 today, improving but still remains low; iron profile pending -Given the ongoing menorrhagia and symptoms associated with anemia, I recommend repeating a course of IV iron infusion (Venofer 2x/week x 4 treatments) -We will see her back in 3 months to repeat labs and see if she needs repeat IV iron  -Continue daily multivitamin   Menorrhagia -Followed by gynecology -Endometrial bx 05/2019 showed disordered proliferative type endometrium and tubal metaplasia; no evidence of hyperplasia or malignancy -Patient deferred treatment last year, but has discussed with her gynecologist regarding ablation, date TBD -I encouraged the patient to work with her gynecologist for the management of menorrhagia   Orders Placed This Encounter  Procedures  . CBC with Differential (Cancer Center Only)    Standing Status:   Future    Standing Expiration Date:   12/26/2020  . CMP ([facility] only)    Standing Status:   Future    Standing Expiration Date:   12/26/2020  . Save Smear ([facility])    Standing Status:   Future    Standing Expiration Date:   11/21/2020  . Ferritin    Standing Status:   Future    Standing Expiration Date:   12/26/2020  . Iron and TIBC    Standing Status:   Future    Standing Expiration Date:   12/26/2020   The total time spent in the encounter was 32 minutes, including face-to-face time with the patient, review of various tests results, order additional studies/medications, documentation, and coordination of care plan.   All questions were answered. The patient knows to call  the clinic with any problems, questions or concerns. No barriers to learning was detected.  Return in 3 months for labs and clinic follow-up.   Tish Men, MD 2/3/20219:02 AM  CHIEF COMPLAINT: "I am doing fine"  INTERVAL HISTORY: Ms. Broers returns to clinic for follow-up of iron deficiency anemia secondary to menorrhagia.  Patient reports that since the last IV iron infusion, her craving for ice has resolved.  She is still somewhat tired, but her fatigue is overall improving.  She still has a persistent menorrhagia, for which she has discussed with her gynecologist regarding ablation, date not yet scheduled.  She denies any other symptoms of bleeding.  She denies any other complaint today.  REVIEW OF SYSTEMS:   Constitutional: ( - ) fevers, ( - )  chills , ( - ) night sweats Eyes: ( - ) blurriness of vision, ( - ) double vision, ( - ) watery eyes Ears, nose, mouth, throat, and face: ( - ) mucositis, ( - ) sore throat Respiratory: ( - ) cough, ( - ) dyspnea, ( - ) wheezes Cardiovascular: ( - ) palpitation, ( - ) chest discomfort, ( - ) lower extremity swelling Gastrointestinal:  ( - ) nausea, ( - ) heartburn, ( - ) change in bowel habits Skin: ( - ) abnormal skin rashes Lymphatics: ( - ) new lymphadenopathy, ( - ) easy bruising Neurological: ( - ) numbness, ( - ) tingling, ( - ) new weaknesses Behavioral/Psych: ( - ) mood change, ( - )  new changes  All other systems were reviewed with the patient and are negative.  SUMMARY OF ONCOLOGIC HISTORY: Oncology History   No history exists.    I have reviewed the past medical history, past surgical history, social history and family history with the patient and they are unchanged from previous note.  ALLERGIES:  is allergic to lisinopril.  MEDICATIONS:  Current Outpatient Medications  Medication Sig Dispense Refill  . bisoprolol-hydrochlorothiazide (ZIAC) 2.5-6.25 MG tablet Take by mouth.    . cetirizine (ZYRTEC) 10 MG tablet Take 10  mg by mouth as needed for allergies.    . Cholecalciferol (VITAMIN D3) 1.25 MG (50000 UT) CAPS Take 1 capsule by mouth once a week. X 8 weeks    . desvenlafaxine (PRISTIQ) 50 MG 24 hr tablet Take 50 mg by mouth daily.    Chilton Si Tea, Camellia sinensis, (GREEN TEA EXTRACT PO) Take 1 tablet by mouth daily. With Red yeast rice    . triamcinolone (NASACORT) 55 MCG/ACT AERO nasal inhaler Place 2 sprays into the nose as needed. 2 sprays each nostril PRN     No current facility-administered medications for this visit.    PHYSICAL EXAMINATION: ECOG PERFORMANCE STATUS: 0 - Asymptomatic  Today's Vitals   11/22/19 0843 11/22/19 0848  BP: (!) 130/96   Pulse: 84   Resp: 18   Temp: (!) 97.3 F (36.3 C)   TempSrc: Temporal   SpO2: 100%   Weight: 252 lb 4.8 oz (114.4 kg)   Height: 5\' 8"  (1.727 m)   PainSc:  0-No pain   Body mass index is 38.36 kg/m.  Filed Weights   11/22/19 0843  Weight: 252 lb 4.8 oz (114.4 kg)    GENERAL: alert, no distress and comfortable SKIN: skin color, texture, turgor are normal, no rashes or significant lesions EYES: conjunctiva are pink and non-injected, sclera clear OROPHARYNX: no exudate, no erythema; lips, buccal mucosa, and tongue normal  NECK: supple, non-tender LYMPH:  no palpable lymphadenopathy in the cervical LUNGS: clear to auscultation with normal breathing effort HEART: regular rate & rhythm and no murmurs and no lower extremity edema ABDOMEN: soft, non-tender, non-distended, normal bowel sounds Musculoskeletal: no cyanosis of digits and no clubbing  PSYCH: alert & oriented x 3, fluent speech  LABORATORY DATA:  I have reviewed the data as listed    Component Value Date/Time   NA 141 07/19/2019 0908   K 4.1 07/19/2019 0908   CL 108 07/19/2019 0908   CO2 25 07/19/2019 0908   GLUCOSE 97 07/19/2019 0908   GLUCOSE 75 08/02/2006 1548   BUN 8 07/19/2019 0908   CREATININE 0.83 07/19/2019 0908   CREATININE 0.78 03/24/2012 1026   CALCIUM 9.3  07/19/2019 0908   PROT 7.3 07/19/2019 0908   ALBUMIN 3.8 07/19/2019 0908   AST 10 (L) 07/19/2019 0908   ALT 8 07/19/2019 0908   ALKPHOS 94 07/19/2019 0908   BILITOT <0.2 (L) 07/19/2019 0908   GFRNONAA >60 07/19/2019 0908   GFRAA >60 07/19/2019 0908    No results found for: SPEP, UPEP  Lab Results  Component Value Date   WBC 5.8 11/22/2019   NEUTROABS 3.5 11/22/2019   HGB 10.7 (L) 11/22/2019   HCT 35.1 (L) 11/22/2019   MCV 77.0 (L) 11/22/2019   PLT 299 11/22/2019      Chemistry      Component Value Date/Time   NA 141 07/19/2019 0908   K 4.1 07/19/2019 0908   CL 108 07/19/2019 0908   CO2  25 07/19/2019 0908   BUN 8 07/19/2019 0908   CREATININE 0.83 07/19/2019 0908   CREATININE 0.78 03/24/2012 1026      Component Value Date/Time   CALCIUM 9.3 07/19/2019 0908   ALKPHOS 94 07/19/2019 0908   AST 10 (L) 07/19/2019 0908   ALT 8 07/19/2019 0908   BILITOT <0.2 (L) 07/19/2019 0908       RADIOGRAPHIC STUDIES: I have personally reviewed the radiological images as listed below and agreed with the findings in the report. No results found.

## 2019-11-23 ENCOUNTER — Telehealth: Payer: Self-pay | Admitting: Hematology

## 2019-11-23 NOTE — Telephone Encounter (Signed)
I left a message regarding schedule  

## 2019-11-27 ENCOUNTER — Other Ambulatory Visit: Payer: Self-pay

## 2019-11-27 ENCOUNTER — Inpatient Hospital Stay: Payer: 59

## 2019-11-27 VITALS — BP 129/79 | HR 64 | Temp 98.9°F | Resp 18

## 2019-11-27 DIAGNOSIS — D500 Iron deficiency anemia secondary to blood loss (chronic): Secondary | ICD-10-CM

## 2019-11-27 MED ORDER — SODIUM CHLORIDE 0.9 % IV SOLN
INTRAVENOUS | Status: DC
  Filled 2019-11-27: qty 250

## 2019-11-27 MED ORDER — SODIUM CHLORIDE 0.9 % IV SOLN
200.0000 mg | Freq: Once | INTRAVENOUS | Status: AC
  Administered 2019-11-27: 200 mg via INTRAVENOUS
  Filled 2019-11-27: qty 200

## 2019-11-27 NOTE — Patient Instructions (Signed)

## 2019-11-30 ENCOUNTER — Ambulatory Visit: Payer: 59 | Attending: Internal Medicine

## 2019-11-30 ENCOUNTER — Telehealth: Payer: Self-pay | Admitting: Physician Assistant

## 2019-11-30 DIAGNOSIS — Z20822 Contact with and (suspected) exposure to covid-19: Secondary | ICD-10-CM

## 2019-11-30 NOTE — Telephone Encounter (Signed)
Returned patient's phone call regarding rescheduling an appointment, left a voicemail. 

## 2019-12-01 ENCOUNTER — Telehealth: Payer: Self-pay | Admitting: *Deleted

## 2019-12-01 ENCOUNTER — Inpatient Hospital Stay: Payer: 59

## 2019-12-01 LAB — NOVEL CORONAVIRUS, NAA: SARS-CoV-2, NAA: NOT DETECTED

## 2019-12-01 NOTE — Telephone Encounter (Signed)
Received fax'ed call from on call service stating that pt is cancelling her appt for this morning d/t getting a covid test done yesterday. Scheduling message sent to have this appointment re-scheduled as pt will need a total of 4 infusions of Venofer.  Pt's covid test came back negative.

## 2019-12-01 NOTE — Telephone Encounter (Signed)
Okay thanks for letting me know.  Dr. Channah Godeaux  

## 2019-12-05 ENCOUNTER — Other Ambulatory Visit: Payer: Self-pay

## 2019-12-05 ENCOUNTER — Inpatient Hospital Stay: Payer: 59

## 2019-12-05 VITALS — BP 116/73 | HR 67 | Temp 97.9°F | Resp 18

## 2019-12-05 DIAGNOSIS — D500 Iron deficiency anemia secondary to blood loss (chronic): Secondary | ICD-10-CM

## 2019-12-05 MED ORDER — SODIUM CHLORIDE 0.9 % IV SOLN
200.0000 mg | Freq: Once | INTRAVENOUS | Status: AC
  Administered 2019-12-05: 200 mg via INTRAVENOUS
  Filled 2019-12-05: qty 200

## 2019-12-05 MED ORDER — SODIUM CHLORIDE 0.9 % IV SOLN
INTRAVENOUS | Status: DC
  Filled 2019-12-05: qty 250

## 2019-12-05 NOTE — Progress Notes (Signed)
Pt tolerated iron treatment without complaints.  VSS.  Pt refused 30 minutes post observation due to need to get to work.

## 2019-12-05 NOTE — Patient Instructions (Signed)

## 2019-12-07 ENCOUNTER — Telehealth: Payer: Self-pay | Admitting: Emergency Medicine

## 2019-12-07 NOTE — Telephone Encounter (Signed)
Called pt regarding appt for venofer scheduled on 2/19 at 0815.  Request to reschedule to 2/20 at 0800.  Pt cannot come Saturday d/t lack of childcare, requested another appt for Venofer next week.  Appt for 2/19 cancelled, scheduling message sent requesting new appt for venofer infusion in addition to venofer infusion already scheduled for 2/26.  Pt verbalized understanding of change in appts and to expect a call from scheduling dept regarding additional appt next week.

## 2019-12-08 ENCOUNTER — Inpatient Hospital Stay: Payer: 59

## 2019-12-15 ENCOUNTER — Other Ambulatory Visit: Payer: Self-pay

## 2019-12-15 ENCOUNTER — Inpatient Hospital Stay: Payer: 59

## 2019-12-15 VITALS — BP 124/69 | HR 69 | Temp 98.9°F | Resp 18

## 2019-12-15 DIAGNOSIS — D500 Iron deficiency anemia secondary to blood loss (chronic): Secondary | ICD-10-CM | POA: Diagnosis not present

## 2019-12-15 MED ORDER — SODIUM CHLORIDE 0.9 % IV SOLN
200.0000 mg | Freq: Once | INTRAVENOUS | Status: AC
  Administered 2019-12-15: 200 mg via INTRAVENOUS
  Filled 2019-12-15: qty 200

## 2019-12-15 MED ORDER — SODIUM CHLORIDE 0.9 % IV SOLN
Freq: Once | INTRAVENOUS | Status: AC
  Filled 2019-12-15: qty 250

## 2019-12-15 NOTE — Patient Instructions (Signed)

## 2019-12-22 ENCOUNTER — Inpatient Hospital Stay: Payer: 59 | Attending: Hematology

## 2019-12-22 ENCOUNTER — Other Ambulatory Visit: Payer: Self-pay

## 2019-12-22 VITALS — BP 132/69 | HR 63 | Temp 98.6°F | Resp 18

## 2019-12-22 DIAGNOSIS — D500 Iron deficiency anemia secondary to blood loss (chronic): Secondary | ICD-10-CM | POA: Insufficient documentation

## 2019-12-22 DIAGNOSIS — N920 Excessive and frequent menstruation with regular cycle: Secondary | ICD-10-CM | POA: Diagnosis not present

## 2019-12-22 MED ORDER — SODIUM CHLORIDE 0.9 % IV SOLN
INTRAVENOUS | Status: DC
  Filled 2019-12-22: qty 250

## 2019-12-22 MED ORDER — SODIUM CHLORIDE 0.9 % IV SOLN
200.0000 mg | Freq: Once | INTRAVENOUS | Status: AC
  Administered 2019-12-22: 200 mg via INTRAVENOUS
  Filled 2019-12-22: qty 200

## 2019-12-22 NOTE — Patient Instructions (Signed)
Iron Sucrose injection What is this medicine? IRON SUCROSE (AHY ern SOO krohs) is an iron complex. Iron is used to make healthy red blood cells, which carry oxygen and nutrients throughout the body. This medicine is used to treat iron deficiency anemia in people with chronic kidney disease. This medicine may be used for other purposes; ask your health care provider or pharmacist if you have questions. COMMON BRAND NAME(S): Venofer What should I tell my health care provider before I take this medicine? They need to know if you have any of these conditions:  anemia not caused by low iron levels  heart disease  high levels of iron in the blood  kidney disease  liver disease  an unusual or allergic reaction to iron, other medicines, foods, dyes, or preservatives  pregnant or trying to get pregnant  breast-feeding How should I use this medicine? This medicine is for infusion into a vein. It is given by a health care professional in a hospital or clinic setting. Talk to your pediatrician regarding the use of this medicine in children. While this drug may be prescribed for children as young as 2 years for selected conditions, precautions do apply. Overdosage: If you think you have taken too much of this medicine contact a poison control center or emergency room at once. NOTE: This medicine is only for you. Do not share this medicine with others. What if I miss a dose? It is important not to miss your dose. Call your doctor or health care professional if you are unable to keep an appointment. What may interact with this medicine? Do not take this medicine with any of the following medications:  deferoxamine  dimercaprol  other iron products This medicine may also interact with the following medications:  chloramphenicol  deferasirox This list may not describe all possible interactions. Give your health care provider a list of all the medicines, herbs, non-prescription drugs, or  dietary supplements you use. Also tell them if you smoke, drink alcohol, or use illegal drugs. Some items may interact with your medicine. What should I watch for while using this medicine? Visit your doctor or healthcare professional regularly. Tell your doctor or healthcare professional if your symptoms do not start to get better or if they get worse. You may need blood work done while you are taking this medicine. You may need to follow a special diet. Talk to your doctor. Foods that contain iron include: whole grains/cereals, dried fruits, beans, or peas, leafy green vegetables, and organ meats (liver, kidney). What side effects may I notice from receiving this medicine? Side effects that you should report to your doctor or health care professional as soon as possible:  allergic reactions like skin rash, itching or hives, swelling of the face, lips, or tongue  breathing problems  changes in blood pressure  cough  fast, irregular heartbeat  feeling faint or lightheaded, falls  fever or chills  flushing, sweating, or hot feelings  joint or muscle aches/pains  seizures  swelling of the ankles or feet  unusually weak or tired Side effects that usually do not require medical attention (report to your doctor or health care professional if they continue or are bothersome):  diarrhea  feeling achy  headache  irritation at site where injected  nausea, vomiting  stomach upset  tiredness This list may not describe all possible side effects. Call your doctor for medical advice about side effects. You may report side effects to FDA at 1-800-FDA-1088. Where should I keep   my medicine? This drug is given in a hospital or clinic and will not be stored at home. NOTE: This sheet is a summary. It may not cover all possible information. If you have questions about this medicine, talk to your doctor, pharmacist, or health care provider.  2020 Elsevier/Gold Standard (2011-07-16  17:14:35) Coronavirus (COVID-19) Are you at risk?  Are you at risk for the Coronavirus (COVID-19)?  To be considered HIGH RISK for Coronavirus (COVID-19), you have to meet the following criteria:  . Traveled to [place], [place], [place], [place] or [place]; or in the [place] to [place], [place], [place], or [place]; and have fever, cough, and shortness of breath within the last 2 weeks of travel OR . Been in close contact with a person diagnosed with COVID-19 within the last 2 weeks and have fever, cough, and shortness of breath . IF YOU DO NOT MEET THESE CRITERIA, YOU ARE CONSIDERED LOW RISK FOR COVID-19.  What to do if you are HIGH RISK for COVID-19?  . If you are having a medical emergency, call 911. . Seek medical care right away. Before you go to a doctor's office, urgent care or emergency department, call ahead and tell them about your recent travel, contact with someone diagnosed with COVID-19, and your symptoms. You should receive instructions from your physician's office regarding next steps of care.  . When you arrive at healthcare provider, tell the healthcare staff immediately you have returned from visiting [place], [place], [place], [place] or [place]; or traveled in the [place] to [place], [place], [place], or [place]; in the last two weeks or you have been in close contact with a person diagnosed with COVID-19 in the last 2 weeks.   . Tell the health care staff about your symptoms: fever, cough and shortness of breath. . After you have been seen by a medical provider, you will be either: o Tested for (COVID-19) and discharged home on quarantine except to seek medical care if symptoms worsen, and asked to  - Stay home and avoid contact with others until you get your results (4-5 days)  - Avoid travel on public transportation if possible (such as bus, train, or airplane) or o Sent to the Emergency Department by EMS for evaluation, COVID-19 testing, and  possible admission depending on your condition and test results.  What to do if you are LOW RISK for COVID-19?  Reduce your risk of any infection by using the same precautions used for avoiding the common cold or flu:  . Wash your hands often with soap and warm water for at least 20 seconds.  If soap and water are not readily available, use an alcohol-based hand sanitizer with at least 60% alcohol.  . If coughing or sneezing, cover your mouth and nose by coughing or sneezing into the elbow areas of your shirt or coat, into a tissue or into your sleeve (not your hands). . Avoid shaking hands with others and consider head nods or verbal greetings only. . Avoid touching your eyes, nose, or mouth with unwashed hands.  . Avoid close contact with people who are sick. . Avoid places or events with large numbers of people in one location, like concerts or sporting events. . Carefully consider travel plans you have or are making. . If you are planning any travel outside or inside the [place], visit the CDC's Travelers' Health webpage for the latest health notices. . If you have some symptoms but not all   symptoms, continue to monitor at home and seek medical attention if your symptoms worsen. . If you are having a medical emergency, call 911.   ADDITIONAL HEALTHCARE OPTIONS FOR PATIENTS  [facility] / e-Visit: [email or web address]         [facility]: [phone]  [facility]: [phone]                   [facility]: [phone]  

## 2020-02-16 NOTE — Progress Notes (Signed)
[facility] OFFICE PROGRESS NOTE  Patient, No Pcp Per No address on file  DIAGNOSIS: Iron deficiency anemia secondary to menorrhagia   PRIOR THERAPY: none   CURRENT THERAPY:  1) as needed IV iron with Venofer.  Last received on 12/22/2019  INTERVAL HISTORY: Lauren Walls 49 y.o. female returns to the clinic today for a follow-up visit.  The patient is feeling well today without any concerning complaints except fatigue.  The patient received Venofer on 12/22/2019.  She tolerated well without any adverse side effects.  She does not take an oral iron supplement because it upsets her stomach. Today, she denies any lightheadedness, dizziness, chest pain, palpitations, or shortness of breath. Her ice cravings went away following her last IV iron infusion. She reports a history of menorrhagia.  She states that her menstrual periods are "on and off heavy".  She states that she has a period approximately once every 4 weeks and her periods last approximately 7 days.  Her menstrual periods are not as heavy as they had been in the past.  She is planning on having a endometrial ablation later this month.  They are also planning on putting in a hormonal IUD to help with her heavy menstrual periods.  She denies any other bleeding or bruising including epistaxis, gingival bleeding, hemoptysis, hematemesis, melena, hematochezia, or hematuria.   The patient is here today for evaluation and repeat CBC, iron studies, and ferritin.  MEDICAL HISTORY: Past Medical History:  Diagnosis Date  . Abnormal Pap smear 05/2011   colpo  . Blood transfusion 2008  . Depression   . H/O candidiasis    BV   . Hypertension     ALLERGIES:  is allergic to lisinopril.  MEDICATIONS:  Current Outpatient Medications  Medication Sig Dispense Refill  . bisoprolol-hydrochlorothiazide (ZIAC) 2.5-6.25 MG tablet Take by mouth.    . cetirizine (ZYRTEC) 10 MG tablet Take 10 mg by mouth as needed for allergies.    Marland Kitchen  desvenlafaxine (PRISTIQ) 50 MG 24 hr tablet Take 50 mg by mouth daily.    Nyoka Cowden Tea, Camellia sinensis, (GREEN TEA EXTRACT PO) Take 1 tablet by mouth daily. With Red yeast rice    . Multiple Vitamin (MULTI VITAMIN DAILY) TABS     . triamcinolone (NASACORT) 55 MCG/ACT AERO nasal inhaler Place 2 sprays into the nose as needed. 2 sprays each nostril PRN    . FeFum-FePoly-FA-B Cmp-C-Biot (INTEGRA PLUS) CAPS Take 1 capsule by mouth every morning. 30 capsule 2   No current facility-administered medications for this visit.    SURGICAL HISTORY:  Past Surgical History:  Procedure Laterality Date  . CESAREAN SECTION  04/03/2012   Procedure: CESAREAN SECTION;  Surgeon: Delice Lesch, MD;  Location: [facility];  Service: Gynecology;  Laterality: N/A;  Primary Cesarean Section with birth of baby boy @ 83 apgars 8/9  . DILATION AND CURETTAGE OF UTERUS  2008  . TONSILLECTOMY    . WISDOM TOOTH EXTRACTION     x 4    REVIEW OF SYSTEMS:   Review of Systems  Constitutional: Positive for fatigue. Negative for appetite change, chills, fever and unexpected weight change.  HENT: Negative for mouth sores, nosebleeds, sore throat and trouble swallowing.   Eyes: Negative for eye problems and icterus.  Respiratory: Negative for cough, hemoptysis, shortness of breath and wheezing.  Cardiovascular: Negative for chest pain and leg swelling.  Gastrointestinal: Negative for abdominal pain, constipation, diarrhea, nausea and vomiting.  Genitourinary: Negative for bladder  incontinence, difficulty urinating, dysuria, frequency and hematuria.   Musculoskeletal: Negative for back pain, gait problem, neck pain and neck stiffness.  Skin: Negative for itching and rash.  Neurological: Negative for dizziness, extremity weakness, gait problem, headaches, light-headedness and seizures.  Hematological: Negative for adenopathy. Does not bruise/bleed easily.  Psychiatric/Behavioral: Negative for confusion, depression and sleep  disturbance. The patient is not nervous/anxious.     PHYSICAL EXAMINATION:  Blood pressure (!) 145/95, pulse 77, temperature 98.3 F (36.8 C), temperature source Temporal, resp. rate 20, height '5\' 8"'  (1.727 m), weight 256 lb 9.6 oz (116.4 kg), SpO2 100 %.  ECOG PERFORMANCE STATUS: 1 - Symptomatic but completely ambulatory  Physical Exam  Constitutional: Oriented to person, place, and time and well-developed, well-nourished, and in no distress.  HENT:  Head: Normocephalic and atraumatic.  Mouth/Throat: Oropharynx is clear and moist. No oropharyngeal exudate.  Eyes: Conjunctivae are normal. Right eye exhibits no discharge. Left eye exhibits no discharge. No scleral icterus.  Neck: Normal range of motion. Neck supple.  Cardiovascular: Normal rate, regular rhythm, normal heart sounds and intact distal pulses.   Pulmonary/Chest: Effort normal and breath sounds normal. No respiratory distress. No wheezes. No rales.  Abdominal: Soft. Bowel sounds are normal. Exhibits no distension and no mass. There is no tenderness.  Musculoskeletal: Normal range of motion. Exhibits no edema.  Lymphadenopathy:    No cervical adenopathy.  Neurological: Alert and oriented to person, place, and time. Exhibits normal muscle tone. Gait normal. Coordination normal.  Skin: Skin is warm and dry. No rash noted. Not diaphoretic. No erythema. No pallor.  Psychiatric: Mood, memory and judgment normal.  Vitals reviewed.  LABORATORY DATA: Lab Results  Component Value Date   WBC 4.8 02/19/2020   HGB 11.2 (L) 02/19/2020   HCT 36.8 02/19/2020   MCV 81.4 02/19/2020   PLT 306 02/19/2020      Chemistry      Component Value Date/Time   NA 143 02/19/2020 0921   K 3.5 02/19/2020 0921   CL 108 02/19/2020 0921   CO2 25 02/19/2020 0921   BUN 9 02/19/2020 0921   CREATININE 0.80 02/19/2020 0921   CREATININE 0.78 03/24/2012 1026      Component Value Date/Time   CALCIUM 9.1 02/19/2020 0921   ALKPHOS 79 02/19/2020 0921    AST 15 02/19/2020 0921   ALT 14 02/19/2020 0921   BILITOT 0.3 02/19/2020 0921       RADIOGRAPHIC STUDIES:  No results found.   ASSESSMENT/PLAN:  This is a very pleasant 49 year old African American female referred to the clinic for evaluation of iron deficiency anemia secondary to blood loss from menorrhagia.  The patient was seen with Dr. Julien Nordmann today.  The patient had a repeat CBC, iron studies, and ferritin.  The patient's labs demonstrate a hemoglobin of 11.2.  Her iron studies are pending at this time.  We recommend that the patient continue on oral iron supplements.  I will arrange for an iron infusion depending on the results of her ferritin and iron studies. I will personally call the patient with the results.   The patient does not take an iron supplement due to GI upset. I will send a prescription for integra plus for an oral iron supplement.   The patient has a family history of  Multiple myeloma in her mother. Given her anemia, Dr. Julien Nordmann recommends adding an SPEP to her lab studies today.   The patient will continue to follow with her OB/GYN regarding her menorrhagia. She  plans to get an endometrial ablation later this month.   We will see the patient back for follow-up visit in 2-3 months for evaluation and a repeat CBC, iron studies, and ferritin.  The patient was advised to call immediately if she has any concerning symptoms in the interval. The patient voices understanding of current disease status and treatment options and is in agreement with the current care plan. All questions were answered. The patient knows to call the clinic with any problems, questions or concerns. We can certainly see the patient much sooner if necessary  Orders Placed This Encounter  Procedures  . SPEP with reflex to IFE    Standing Status:   Future    Number of Occurrences:   1    Standing Expiration Date:   02/18/2021  . CBC with Differential (Cancer Center Only)    Standing  Status:   Future    Standing Expiration Date:   02/18/2021  . CMP ([facility] only)    Standing Status:   Future    Standing Expiration Date:   02/18/2021  . Ferritin    Standing Status:   Future    Standing Expiration Date:   02/18/2021  . Iron and TIBC    Standing Status:   Future    Standing Expiration Date:   02/18/2021     Lauren Sos Pheng Prokop, PA-C 02/19/20  ADDENDUM: Hematology/Oncology Attending: I had a face-to-face encounter with the patient today.  I recommended her care plan.  This is a very pleasant 49 years old African-American female with persistent iron deficiency anemia secondary to menorrhagia.  The patient was treated in the past with iron infusion and she felt much better.  She is here today for evaluation and repeat blood work including CBC, iron study and ferritin.  CBC showed mild anemia with hemoglobin of 11.2.  Her ferritin level is normal at 65 and iron studies showed low serum iron and iron saturation. I recommended for the patient to continue iron supplements orally and we will give her prescription for Integra +1 capsule p.o. daily. If no improvement in her iron deficiency, we will reconsider her for iron infusion. The patient also has a family history of multiple myeloma and amyloidosis.  We will order serum protein electrophoresis with immunofixation. The patient will come back for follow-up visit in 3 months for evaluation and repeat CBC, iron study and ferritin. She was advised to call immediately if she has any concerning symptoms in the interval.  Disclaimer: This note was dictated with voice recognition software. Similar sounding words can inadvertently be transcribed and may be missed upon review.  Disclaimer: This note was dictated with voice recognition software. Similar sounding words can inadvertently be transcribed and may be missed upon review. Eilleen Kempf, MD 02/19/20

## 2020-02-19 ENCOUNTER — Inpatient Hospital Stay: Payer: 59 | Attending: Hematology

## 2020-02-19 ENCOUNTER — Telehealth: Payer: Self-pay | Admitting: Physician Assistant

## 2020-02-19 ENCOUNTER — Other Ambulatory Visit: Payer: Self-pay

## 2020-02-19 ENCOUNTER — Encounter: Payer: Self-pay | Admitting: Physician Assistant

## 2020-02-19 ENCOUNTER — Inpatient Hospital Stay: Payer: 59 | Admitting: Physician Assistant

## 2020-02-19 VITALS — BP 145/95 | HR 77 | Temp 98.3°F | Resp 20 | Ht 68.0 in | Wt 256.6 lb

## 2020-02-19 DIAGNOSIS — D500 Iron deficiency anemia secondary to blood loss (chronic): Secondary | ICD-10-CM | POA: Diagnosis not present

## 2020-02-19 DIAGNOSIS — N920 Excessive and frequent menstruation with regular cycle: Secondary | ICD-10-CM | POA: Insufficient documentation

## 2020-02-19 LAB — CBC WITH DIFFERENTIAL (CANCER CENTER ONLY)
Abs Immature Granulocytes: 0.01 10*3/uL (ref 0.00–0.07)
Basophils Absolute: 0 10*3/uL (ref 0.0–0.1)
Basophils Relative: 0 %
Eosinophils Absolute: 0.1 10*3/uL (ref 0.0–0.5)
Eosinophils Relative: 2 %
HCT: 36.8 % (ref 36.0–46.0)
Hemoglobin: 11.2 g/dL — ABNORMAL LOW (ref 12.0–15.0)
Immature Granulocytes: 0 %
Lymphocytes Relative: 34 %
Lymphs Abs: 1.6 10*3/uL (ref 0.7–4.0)
MCH: 24.8 pg — ABNORMAL LOW (ref 26.0–34.0)
MCHC: 30.4 g/dL (ref 30.0–36.0)
MCV: 81.4 fL (ref 80.0–100.0)
Monocytes Absolute: 0.3 10*3/uL (ref 0.1–1.0)
Monocytes Relative: 5 %
Neutro Abs: 2.8 10*3/uL (ref 1.7–7.7)
Neutrophils Relative %: 59 %
Platelet Count: 306 10*3/uL (ref 150–400)
RBC: 4.52 MIL/uL (ref 3.87–5.11)
RDW: 16.6 % — ABNORMAL HIGH (ref 11.5–15.5)
WBC Count: 4.8 10*3/uL (ref 4.0–10.5)
nRBC: 0 % (ref 0.0–0.2)

## 2020-02-19 LAB — CMP (CANCER CENTER ONLY)
ALT: 14 U/L (ref 0–44)
AST: 15 U/L (ref 15–41)
Albumin: 3.8 g/dL (ref 3.5–5.0)
Alkaline Phosphatase: 79 U/L (ref 38–126)
Anion gap: 10 (ref 5–15)
BUN: 9 mg/dL (ref 6–20)
CO2: 25 mmol/L (ref 22–32)
Calcium: 9.1 mg/dL (ref 8.9–10.3)
Chloride: 108 mmol/L (ref 98–111)
Creatinine: 0.8 mg/dL (ref 0.44–1.00)
GFR, Est AFR Am: >60 mL/min (ref >60)
GFR, Estimated: >60 mL/min (ref >60)
Glucose, Bld: 86 mg/dL (ref 70–99)
Potassium: 3.5 mmol/L (ref 3.5–5.1)
Sodium: 143 mmol/L (ref 135–145)
Total Bilirubin: 0.3 mg/dL (ref 0.3–1.2)
Total Protein: 7.3 g/dL (ref 6.5–8.1)

## 2020-02-19 LAB — IRON AND TIBC
Iron: 43 ug/dL (ref 41–142)
Saturation Ratios: 14 % — ABNORMAL LOW (ref 21–57)
TIBC: 311 ug/dL (ref 236–444)
UIBC: 268 ug/dL (ref 120–384)

## 2020-02-19 LAB — FERRITIN: Ferritin: 65 ng/mL (ref 11–307)

## 2020-02-19 LAB — SAVE SMEAR(SSMR), FOR PROVIDER SLIDE REVIEW

## 2020-02-19 MED ORDER — INTEGRA PLUS PO CAPS: 1.0000 | ORAL_CAPSULE | Freq: Every morning | ORAL | 2 refills | Status: DC

## 2020-02-19 NOTE — Telephone Encounter (Signed)
Called the patient to go over her iron and ferritin results. Would encourage her to try the oral iron supplements for now. We will recheck her blood work in 2-3 months.

## 2020-02-20 ENCOUNTER — Telehealth: Payer: Self-pay | Admitting: Physician Assistant

## 2020-02-20 ENCOUNTER — Other Ambulatory Visit: Payer: Self-pay | Admitting: Physician Assistant

## 2020-02-20 LAB — PROTEIN ELECTROPHORESIS, SERUM, WITH REFLEX
A/G Ratio: 1.2 (ref 0.7–1.7)
Albumin ELP: 3.7 g/dL (ref 2.9–4.4)
Alpha-1-Globulin: 0.2 g/dL (ref 0.0–0.4)
Alpha-2-Globulin: 0.9 g/dL (ref 0.4–1.0)
Beta Globulin: 1.1 g/dL (ref 0.7–1.3)
Gamma Globulin: 1 g/dL (ref 0.4–1.8)
Globulin, Total: 3.2 g/dL (ref 2.2–3.9)
Total Protein ELP: 6.9 g/dL (ref 6.0–8.5)

## 2020-02-20 NOTE — Telephone Encounter (Signed)
Scheduled per los. Called and left msg. Mailed printout  °

## 2020-02-21 ENCOUNTER — Telehealth: Payer: Self-pay | Admitting: Physician Assistant

## 2020-02-21 NOTE — Telephone Encounter (Signed)
Called the patient to tell her that her SPEP was unremarkable. She expressed understanding

## 2020-03-11 ENCOUNTER — Other Ambulatory Visit: Payer: Self-pay | Admitting: Obstetrics and Gynecology

## 2020-05-06 ENCOUNTER — Other Ambulatory Visit: Payer: Self-pay

## 2020-05-06 ENCOUNTER — Telehealth: Payer: Self-pay | Admitting: Internal Medicine

## 2020-05-06 ENCOUNTER — Encounter: Payer: Self-pay | Admitting: Internal Medicine

## 2020-05-06 ENCOUNTER — Inpatient Hospital Stay: Payer: 59 | Attending: Hematology | Admitting: Internal Medicine

## 2020-05-06 ENCOUNTER — Inpatient Hospital Stay: Payer: 59

## 2020-05-06 VITALS — BP 138/92 | HR 105 | Temp 97.7°F | Resp 20 | Wt 257.3 lb

## 2020-05-06 DIAGNOSIS — Z975 Presence of (intrauterine) contraceptive device: Secondary | ICD-10-CM | POA: Insufficient documentation

## 2020-05-06 DIAGNOSIS — D500 Iron deficiency anemia secondary to blood loss (chronic): Secondary | ICD-10-CM | POA: Insufficient documentation

## 2020-05-06 DIAGNOSIS — N920 Excessive and frequent menstruation with regular cycle: Secondary | ICD-10-CM | POA: Diagnosis present

## 2020-05-06 LAB — CBC WITH DIFFERENTIAL (CANCER CENTER ONLY)
Abs Immature Granulocytes: 0.01 10*3/uL (ref 0.00–0.07)
Basophils Absolute: 0 10*3/uL (ref 0.0–0.1)
Basophils Relative: 0 %
Eosinophils Absolute: 0.1 10*3/uL (ref 0.0–0.5)
Eosinophils Relative: 1 %
HCT: 40.4 % (ref 36.0–46.0)
Hemoglobin: 12.5 g/dL (ref 12.0–15.0)
Immature Granulocytes: 0 %
Lymphocytes Relative: 38 %
Lymphs Abs: 1.9 10*3/uL (ref 0.7–4.0)
MCH: 24.4 pg — ABNORMAL LOW (ref 26.0–34.0)
MCHC: 30.9 g/dL (ref 30.0–36.0)
MCV: 78.8 fL — ABNORMAL LOW (ref 80.0–100.0)
Monocytes Absolute: 0.4 10*3/uL (ref 0.1–1.0)
Monocytes Relative: 8 %
Neutro Abs: 2.6 10*3/uL (ref 1.7–7.7)
Neutrophils Relative %: 53 %
Platelet Count: 267 10*3/uL (ref 150–400)
RBC: 5.13 MIL/uL — ABNORMAL HIGH (ref 3.87–5.11)
RDW: 15.4 % (ref 11.5–15.5)
WBC Count: 5 10*3/uL (ref 4.0–10.5)
nRBC: 0 % (ref 0.0–0.2)

## 2020-05-06 LAB — CMP (CANCER CENTER ONLY)
ALT: 25 U/L (ref 0–44)
AST: 21 U/L (ref 15–41)
Albumin: 4 g/dL (ref 3.5–5.0)
Alkaline Phosphatase: 76 U/L (ref 38–126)
Anion gap: 11 (ref 5–15)
BUN: 11 mg/dL (ref 6–20)
CO2: 24 mmol/L (ref 22–32)
Calcium: 9.8 mg/dL (ref 8.9–10.3)
Chloride: 107 mmol/L (ref 98–111)
Creatinine: 0.8 mg/dL (ref 0.44–1.00)
GFR, Est AFR Am: >60 mL/min (ref >60)
GFR, Estimated: >60 mL/min (ref >60)
Glucose, Bld: 91 mg/dL (ref 70–99)
Potassium: 4 mmol/L (ref 3.5–5.1)
Sodium: 142 mmol/L (ref 135–145)
Total Bilirubin: 0.5 mg/dL (ref 0.3–1.2)
Total Protein: 7.6 g/dL (ref 6.5–8.1)

## 2020-05-06 LAB — IRON AND TIBC
Iron: 54 ug/dL (ref 41–142)
Saturation Ratios: 17 % — ABNORMAL LOW (ref 21–57)
TIBC: 311 ug/dL (ref 236–444)
UIBC: 257 ug/dL (ref 120–384)

## 2020-05-06 LAB — FERRITIN: Ferritin: 95 ng/mL (ref 11–307)

## 2020-05-06 NOTE — Progress Notes (Signed)
[facility] [phone]   Fax:([phone]  OFFICE PROGRESS NOTE  Patient, No Pcp Per No address on file  DIAGNOSIS: Iron deficiency anemia secondary to menorrhagia  PRIOR THERAPY:  Iron infusion with Venofer.  Last received on 12/22/2019    CURRENT THERAPY:  Integra +1 capsule p.o. daily  INTERVAL HISTORY: Lauren Walls 49 y.o. female returns to the clinic today for follow-up visit.  The patient is feeling fine today with no concerning complaints.  She underwent endometrial curettage as well as IUD placement.  She is feeling much better.  She denied having any significant chest pain, shortness of breath, cough or hemoptysis.  She denied having any nausea, vomiting, diarrhea or constipation.  She denied having any headache or visual changes.  She is currently on Integra +1 capsule p.o. daily and tolerating it fairly well.  She is here today for evaluation and repeat blood work.   MEDICAL HISTORY: Past Medical History:  Diagnosis Date   Abnormal Pap smear 05/2011   colpo   Blood transfusion 2008   Depression    H/O candidiasis    BV    Hypertension     ALLERGIES:  is allergic to lisinopril.  MEDICATIONS:  Current Outpatient Medications  Medication Sig Dispense Refill   bisoprolol-hydrochlorothiazide (ZIAC) 2.5-6.25 MG tablet Take by mouth.     cetirizine (ZYRTEC) 10 MG tablet Take 10 mg by mouth as needed for allergies.     desvenlafaxine (PRISTIQ) 50 MG 24 hr tablet Take 50 mg by mouth daily.     FeFum-FePoly-FA-B Cmp-C-Biot (INTEGRA PLUS) CAPS Take 1 capsule by mouth every morning. 30 capsule 2   Green Tea, Camellia sinensis, (GREEN TEA EXTRACT PO) Take 1 tablet by mouth daily. With Red yeast rice     Multiple Vitamin (MULTI VITAMIN DAILY) TABS      triamcinolone (NASACORT) 55 MCG/ACT AERO nasal inhaler Place 2 sprays into the nose as needed. 2 sprays each nostril PRN     No current facility-administered medications for  this visit.    SURGICAL HISTORY:  Past Surgical History:  Procedure Laterality Date   CESAREAN SECTION  04/03/2012   Procedure: CESAREAN SECTION;  Surgeon: Purcell Nails, MD;  Location: [facility];  Service: Gynecology;  Laterality: N/A;  Primary Cesarean Section with birth of baby boy @ 1542 apgars 8/9   DILATION AND CURETTAGE OF UTERUS  2008   TONSILLECTOMY     WISDOM TOOTH EXTRACTION     x 4    REVIEW OF SYSTEMS:  A comprehensive review of systems was negative.   PHYSICAL EXAMINATION: General appearance: alert, cooperative and no distress Head: Normocephalic, without obvious abnormality, atraumatic Neck: no adenopathy, no JVD, supple, symmetrical, trachea midline and thyroid not enlarged, symmetric, no tenderness/mass/nodules Lymph nodes: Cervical, supraclavicular, and axillary nodes normal. Resp: clear to auscultation bilaterally Back: symmetric, no curvature. ROM normal. No CVA tenderness. Cardio: regular rate and rhythm, S1, S2 normal, no murmur, click, rub or gallop GI: soft, non-tender; bowel sounds normal; no masses,  no organomegaly Extremities: extremities normal, atraumatic, no cyanosis or edema  ECOG PERFORMANCE STATUS: 1 - Symptomatic but completely ambulatory  Blood pressure (!) 138/92, pulse (!) 105, temperature 97.7 F (36.5 C), temperature source Temporal, resp. rate 20, weight 257 lb 4.8 oz (116.7 kg), SpO2 98 %.  LABORATORY DATA: Lab Results  Component Value Date   WBC 5.0 05/06/2020   HGB 12.5 05/06/2020   HCT 40.4 05/06/2020   MCV  78.8 (L) 05/06/2020   PLT 267 05/06/2020      Chemistry      Component Value Date/Time   NA 142 05/06/2020 0919   K 4.0 05/06/2020 0919   CL 107 05/06/2020 0919   CO2 24 05/06/2020 0919   BUN 11 05/06/2020 0919   CREATININE 0.80 05/06/2020 0919   CREATININE 0.78 03/24/2012 1026      Component Value Date/Time   CALCIUM 9.8 05/06/2020 0919   ALKPHOS 76 05/06/2020 0919   AST 21 05/06/2020 0919   ALT 25 05/06/2020  0919   BILITOT 0.5 05/06/2020 0919       RADIOGRAPHIC STUDIES: No results found.  ASSESSMENT AND PLAN: This is a very pleasant 49 years old African-American female with history of iron deficiency anemia secondary to menorrhagia.  She received iron infusion with Venofer completed in March 2021.  The patient is currently on oral iron tablets with Integra plus and she is tolerating it fairly well.  She also recently underwent endometrial curettage with IUD placement. Repeat CBC today showed improvement in her hemoglobin and hematocrit.  Iron study and ferritin are still pending. I recommended for the patient to continue her current treatment with Integra +1 capsule p.o. daily. I will see her back for follow-up visit in 3 months for evaluation with repeat iron study and ferritin as well as CBC. The patient was advised to call immediately if she has any concerning symptoms in the interval. The patient voices understanding of current disease status and treatment options and is in agreement with the current care plan.  All questions were answered. The patient knows to call the clinic with any problems, questions or concerns. We can certainly see the patient much sooner if necessary. The total time spent in the appointment was 20 minutes.  Disclaimer: This note was dictated with voice recognition software. Similar sounding words can inadvertently be transcribed and may not be corrected upon review.

## 2020-05-06 NOTE — Telephone Encounter (Signed)
Scheduled per los. Patient declined printout  

## 2020-05-08 ENCOUNTER — Other Ambulatory Visit: Payer: Self-pay | Admitting: Physician Assistant

## 2020-05-08 DIAGNOSIS — D500 Iron deficiency anemia secondary to blood loss (chronic): Secondary | ICD-10-CM

## 2020-05-20 ENCOUNTER — Encounter: Payer: Self-pay | Admitting: Internal Medicine

## 2020-05-21 ENCOUNTER — Telehealth: Payer: Self-pay | Admitting: Medical Oncology

## 2020-05-21 NOTE — Telephone Encounter (Signed)
-----   Message from Si Gaul, MD sent at 05/20/2020  4:26 PM EDT ----- Regarding: RE: "brain fog" She may need to check with her primary care physician to see if there is any other issues besides the anemia.  Thank you. ----- Message ----- From: Charma Igo, RN Sent: 05/20/2020   3:19 PM EDT To: Si Gaul, MD Subject: "brain fog"                                    Hello Dr. Arbutus Ped,   I'm following up on my appointment last week. I see my hemoglobin and ferritin are slowly increasing, but I'm still experiencing extreme brain fog, difficulty concentrating , and difficulty finding words and completing thoughts. Is there anything that I seem deficient in that would cause this( ie. B12, C or D)?  My energy feels a little better, but this problem has not improved, and I'm becoming concerned.   Thank you,   Lauren Walls

## 2020-05-21 NOTE — Telephone Encounter (Signed)
LVM with Mohamed's recommendation.

## 2020-07-29 ENCOUNTER — Telehealth: Payer: Self-pay | Admitting: Internal Medicine

## 2020-07-29 ENCOUNTER — Other Ambulatory Visit: Payer: Self-pay

## 2020-07-29 ENCOUNTER — Inpatient Hospital Stay: Payer: 59 | Attending: Hematology | Admitting: Internal Medicine

## 2020-07-29 ENCOUNTER — Inpatient Hospital Stay: Payer: 59

## 2020-07-29 ENCOUNTER — Encounter: Payer: Self-pay | Admitting: Internal Medicine

## 2020-07-29 VITALS — BP 121/93 | HR 85 | Temp 97.8°F | Resp 18 | Ht 68.0 in | Wt 252.1 lb

## 2020-07-29 DIAGNOSIS — D500 Iron deficiency anemia secondary to blood loss (chronic): Secondary | ICD-10-CM

## 2020-07-29 DIAGNOSIS — N920 Excessive and frequent menstruation with regular cycle: Secondary | ICD-10-CM | POA: Diagnosis present

## 2020-07-29 LAB — CBC WITH DIFFERENTIAL (CANCER CENTER ONLY)
Abs Immature Granulocytes: 0.01 10*3/uL (ref 0.00–0.07)
Basophils Absolute: 0 10*3/uL (ref 0.0–0.1)
Basophils Relative: 0 %
Eosinophils Absolute: 0.1 10*3/uL (ref 0.0–0.5)
Eosinophils Relative: 1 %
HCT: 42.5 % (ref 36.0–46.0)
Hemoglobin: 13.3 g/dL (ref 12.0–15.0)
Immature Granulocytes: 0 %
Lymphocytes Relative: 40 %
Lymphs Abs: 2.1 10*3/uL (ref 0.7–4.0)
MCH: 25.2 pg — ABNORMAL LOW (ref 26.0–34.0)
MCHC: 31.3 g/dL (ref 30.0–36.0)
MCV: 80.6 fL (ref 80.0–100.0)
Monocytes Absolute: 0.3 10*3/uL (ref 0.1–1.0)
Monocytes Relative: 7 %
Neutro Abs: 2.7 10*3/uL (ref 1.7–7.7)
Neutrophils Relative %: 52 %
Platelet Count: 272 10*3/uL (ref 150–400)
RBC: 5.27 MIL/uL — ABNORMAL HIGH (ref 3.87–5.11)
RDW: 15 % (ref 11.5–15.5)
WBC Count: 5.2 10*3/uL (ref 4.0–10.5)
nRBC: 0 % (ref 0.0–0.2)

## 2020-07-29 LAB — IRON AND TIBC
Iron: 61 ug/dL (ref 41–142)
Saturation Ratios: 22 % (ref 21–57)
TIBC: 280 ug/dL (ref 236–444)
UIBC: 219 ug/dL (ref 120–384)

## 2020-07-29 LAB — FERRITIN: Ferritin: 137 ng/mL (ref 11–307)

## 2020-07-29 NOTE — Progress Notes (Signed)
[facility] [phone]   [phone]  OFFICE PROGRESS NOTE  Patient, Lauren Walls address on file  DIAGNOSIS: Iron deficiency anemia secondary to menorrhagia  PRIOR THERAPY:  Iron infusion with Venofer.  Last received on 12/22/2019    CURRENT THERAPY:  Integra +1 capsule p.o. daily  INTERVAL HISTORY: Lauren Walls 49 y.o. female returns to the clinic today for follow-up visit.  The patient is feeling much better with Lauren concerning complaints except for occasional lack of concentration.  She denied having any fatigue or weakness.  She denied having any chest pain, shortness of breath, cough or hemoptysis.  She has Lauren nausea, vomiting, diarrhea or constipation.  She denied having any headache or visual changes.  She continues to tolerate her treatment with Integra plus fairly well.  The patient is here today for evaluation with repeat CBC, iron study and ferritin.   MEDICAL HISTORY: Past Medical History:  Diagnosis Date  . Abnormal Pap smear 05/2011   colpo  . Blood transfusion 2008  . Depression   . H/O candidiasis    BV   . Hypertension     ALLERGIES:  is allergic to lisinopril.  MEDICATIONS:  Current Outpatient Medications  Medication Sig Dispense Refill  . bisoprolol-hydrochlorothiazide (ZIAC) 5-6.25 MG tablet bisoprolol 5 mg-hydrochlorothiazide 6.25 mg tablet    . cetirizine (ZYRTEC) 10 MG tablet Take 10 mg by mouth as needed for allergies.    Marland Kitchen desvenlafaxine (PRISTIQ) 50 MG 24 hr tablet Take 50 mg by mouth daily.    Chilton Si Tea, Camellia sinensis, (GREEN TEA EXTRACT PO) Take 1 tablet by mouth daily. With Red yeast rice    . levonorgestrel (KYLEENA) 19.5 MG IUD Kyleena 17.5 mcg/24 hrs (63yrs) 19.5mg  intrauterine device  Take 1 device by intrauterine route.    . triamcinolone (NASACORT) 55 MCG/ACT AERO nasal inhaler Place 2 sprays into the nose as needed. 2 sprays each nostril PRN    . vitamin E 1000 UNIT capsule Take 1,000  Units by mouth daily.     Lauren current facility-administered medications for this visit.    SURGICAL HISTORY:  Past Surgical History:  Procedure Laterality Date  . CESAREAN SECTION  04/03/2012   Procedure: CESAREAN SECTION;  Surgeon: Purcell Nails, MD;  Location: [facility];  Service: Gynecology;  Laterality: N/A;  Primary Cesarean Section with birth of baby boy @ 1542 apgars 8/9  . DILATION AND CURETTAGE OF UTERUS  2008  . TONSILLECTOMY    . WISDOM TOOTH EXTRACTION     x 4    REVIEW OF SYSTEMS:  A comprehensive review of systems was negative.   PHYSICAL EXAMINATION: General appearance: alert, cooperative and Lauren distress Head: Normocephalic, without obvious abnormality, atraumatic Neck: Lauren adenopathy, Lauren JVD, supple, symmetrical, trachea midline and thyroid not enlarged, symmetric, Lauren tenderness/mass/nodules Lymph nodes: Cervical, supraclavicular, and axillary nodes normal. Resp: clear to auscultation bilaterally Back: symmetric, Lauren curvature. ROM normal. Lauren CVA tenderness. Cardio: regular rate and rhythm, S1, S2 normal, Lauren murmur, click, rub or gallop GI: soft, non-tender; bowel sounds normal; Lauren masses,  Lauren organomegaly Extremities: extremities normal, atraumatic, Lauren cyanosis or edema  ECOG PERFORMANCE STATUS: 1 - Symptomatic but completely ambulatory  Blood pressure (!) 121/93, pulse 85, temperature 97.8 F (36.6 C), resp. rate 18, height 5\' 8"  (1.727 m), weight 252 lb 1.6 oz (114.4 kg), SpO2 100 %.  LABORATORY DATA: Lab Results  Component Value Date   WBC 5.2 07/29/2020  HGB 13.3 07/29/2020   HCT 42.5 07/29/2020   MCV 80.6 07/29/2020   PLT 272 07/29/2020      Chemistry      Component Value Date/Time   NA 142 05/06/2020 0919   K 4.0 05/06/2020 0919   CL 107 05/06/2020 0919   CO2 24 05/06/2020 0919   BUN 11 05/06/2020 0919   CREATININE 0.80 05/06/2020 0919   CREATININE 0.78 03/24/2012 1026      Component Value Date/Time   CALCIUM 9.8 05/06/2020 0919   ALKPHOS 76  05/06/2020 0919   AST 21 05/06/2020 0919   ALT 25 05/06/2020 0919   BILITOT 0.5 05/06/2020 0919       RADIOGRAPHIC STUDIES: Lauren results found.  ASSESSMENT AND PLAN: This is a very pleasant 49 years old African-American female with history of iron deficiency anemia secondary to menorrhagia.  She received iron infusion with Venofer completed in March 2021.  The patient is currently on oral iron tablets with Integra plus and she is tolerating it fairly well.  She also underwent endometrial curettage with IUD placement. The patient continues to tolerate the oral iron tablet with Integra plus fairly well. Repeat CBC today showed further improvement in her hemoglobin and hematocrit.  The iron study and ferritin are still pending. I recommended for the patient to continue her current treatment with Integra +1 capsule p.o. daily. I will see her back for follow-up visit in 6 months for evaluation with repeat blood work.  If the pending iron study showed severe iron deficiency, we will arrange for the patient to receive iron infusion in the interval. The patient was advised to call immediately if she has any concerning symptoms in the interval. The patient voices understanding of current disease status and treatment options and is in agreement with the current care plan. All questions were answered. The patient knows to call the clinic with any problems, questions or concerns. We can certainly see the patient much sooner if necessary.   Disclaimer: This note was dictated with voice recognition software. Similar sounding words can inadvertently be transcribed and may not be corrected upon review.

## 2020-07-29 NOTE — Telephone Encounter (Signed)
Scheduled appointments per 10/11 los. Spoke to patient who is aware of appointments dates and times. Patient declined calendar print out.

## 2020-10-01 ENCOUNTER — Other Ambulatory Visit: Payer: Self-pay | Admitting: Physician Assistant

## 2020-10-01 DIAGNOSIS — D500 Iron deficiency anemia secondary to blood loss (chronic): Secondary | ICD-10-CM

## 2021-01-27 ENCOUNTER — Encounter: Payer: Self-pay | Admitting: Internal Medicine

## 2021-01-27 ENCOUNTER — Inpatient Hospital Stay: Payer: 59 | Attending: Internal Medicine

## 2021-01-27 ENCOUNTER — Inpatient Hospital Stay: Payer: 59 | Admitting: Internal Medicine

## 2021-01-27 ENCOUNTER — Other Ambulatory Visit: Payer: Self-pay

## 2021-01-27 VITALS — BP 127/90 | HR 69 | Temp 96.8°F | Resp 18 | Ht 68.0 in | Wt 261.5 lb

## 2021-01-27 DIAGNOSIS — N920 Excessive and frequent menstruation with regular cycle: Secondary | ICD-10-CM | POA: Insufficient documentation

## 2021-01-27 DIAGNOSIS — D500 Iron deficiency anemia secondary to blood loss (chronic): Secondary | ICD-10-CM

## 2021-01-27 LAB — CBC WITH DIFFERENTIAL (CANCER CENTER ONLY)
Abs Immature Granulocytes: 0.01 10*3/uL (ref 0.00–0.07)
Basophils Absolute: 0 10*3/uL (ref 0.0–0.1)
Basophils Relative: 1 %
Eosinophils Absolute: 0.1 10*3/uL (ref 0.0–0.5)
Eosinophils Relative: 2 %
HCT: 40 % (ref 36.0–46.0)
Hemoglobin: 12.5 g/dL (ref 12.0–15.0)
Immature Granulocytes: 0 %
Lymphocytes Relative: 33 %
Lymphs Abs: 1.9 10*3/uL (ref 0.7–4.0)
MCH: 25.5 pg — ABNORMAL LOW (ref 26.0–34.0)
MCHC: 31.3 g/dL (ref 30.0–36.0)
MCV: 81.6 fL (ref 80.0–100.0)
Monocytes Absolute: 0.5 10*3/uL (ref 0.1–1.0)
Monocytes Relative: 8 %
Neutro Abs: 3.2 10*3/uL (ref 1.7–7.7)
Neutrophils Relative %: 56 %
Platelet Count: 282 10*3/uL (ref 150–400)
RBC: 4.9 MIL/uL (ref 3.87–5.11)
RDW: 14.6 % (ref 11.5–15.5)
WBC Count: 5.7 10*3/uL (ref 4.0–10.5)
nRBC: 0 % (ref 0.0–0.2)

## 2021-01-27 LAB — IRON AND TIBC
Iron: 56 ug/dL (ref 41–142)
Saturation Ratios: 19 % — ABNORMAL LOW (ref 21–57)
TIBC: 290 ug/dL (ref 236–444)
UIBC: 234 ug/dL (ref 120–384)

## 2021-01-27 LAB — FERRITIN: Ferritin: 156 ng/mL (ref 11–307)

## 2021-01-27 NOTE — Progress Notes (Signed)
[facility] [phone]   [phone]  OFFICE PROGRESS NOTE  Patient, No Pcp Per (Inactive) No address on file  DIAGNOSIS: Iron deficiency anemia secondary to menorrhagia  PRIOR THERAPY:  Iron infusion with Venofer.  Last received on 12/22/2019    CURRENT THERAPY:  Integra +1 capsule p.o. daily  INTERVAL HISTORY: Lauren Walls 50 y.o. female returns to the clinic today for follow-up visit.  The patient is feeling fine today with no concerning complaints.  She has less fatigue than before but also has shortness of breath with exertion.  She denied having any chest pain, cough or hemoptysis.  She denied having any bleeding issues.  She has no nausea, vomiting, diarrhea or constipation.  She has no headache or visual changes.  She continues to tolerate her oral iron tablet with Integra plus fairly well.  She is here today for evaluation and repeat blood work.   MEDICAL HISTORY: Past Medical History:  Diagnosis Date  . Abnormal Pap smear 05/2011   colpo  . Blood transfusion 2008  . Depression   . H/O candidiasis    BV   . Hypertension     ALLERGIES:  is allergic to lisinopril.  MEDICATIONS:  Current Outpatient Medications  Medication Sig Dispense Refill  . bisoprolol-hydrochlorothiazide (ZIAC) 5-6.25 MG tablet bisoprolol 5 mg-hydrochlorothiazide 6.25 mg tablet    . cetirizine (ZYRTEC) 10 MG tablet Take 10 mg by mouth as needed for allergies.    Marland Kitchen desvenlafaxine (PRISTIQ) 50 MG 24 hr tablet Take 50 mg by mouth daily.    Marland Kitchen FeFum-FePoly-FA-B Cmp-C-Biot (INTEGRA PLUS) CAPS TAKE 1 CAPSULE BY MOUTH EVERY MORNING 30 capsule 2  . Green Tea, Camellia sinensis, (GREEN TEA EXTRACT PO) Take 1 tablet by mouth daily. With Red yeast rice    . levonorgestrel (KYLEENA) 19.5 MG IUD Kyleena 17.5 mcg/24 hrs (66yrs) 19.5mg  intrauterine device  Take 1 device by intrauterine route.    Marland Kitchen spironolactone (ALDACTONE) 100 MG tablet Take 100 mg by mouth at  bedtime.    . triamcinolone (NASACORT) 55 MCG/ACT AERO nasal inhaler Place 2 sprays into the nose as needed. 2 sprays each nostril PRN    . vitamin E 1000 UNIT capsule Take 1,000 Units by mouth daily.     No current facility-administered medications for this visit.    SURGICAL HISTORY:  Past Surgical History:  Procedure Laterality Date  . CESAREAN SECTION  04/03/2012   Procedure: CESAREAN SECTION;  Surgeon: Purcell Nails, MD;  Location: [facility];  Service: Gynecology;  Laterality: N/A;  Primary Cesarean Section with birth of baby boy @ 1542 apgars 8/9  . DILATION AND CURETTAGE OF UTERUS  2008  . TONSILLECTOMY    . WISDOM TOOTH EXTRACTION     x 4    REVIEW OF SYSTEMS:  A comprehensive review of systems was negative except for: Respiratory: positive for dyspnea on exertion   PHYSICAL EXAMINATION: General appearance: alert, cooperative and no distress Head: Normocephalic, without obvious abnormality, atraumatic Neck: no adenopathy, no JVD, supple, symmetrical, trachea midline and thyroid not enlarged, symmetric, no tenderness/mass/nodules Lymph nodes: Cervical, supraclavicular, and axillary nodes normal. Resp: clear to auscultation bilaterally Back: symmetric, no curvature. ROM normal. No CVA tenderness. Cardio: regular rate and rhythm, S1, S2 normal, no murmur, click, rub or gallop GI: soft, non-tender; bowel sounds normal; no masses,  no organomegaly Extremities: extremities normal, atraumatic, no cyanosis or edema  ECOG PERFORMANCE STATUS: 1 - Symptomatic but completely ambulatory  Blood pressure 127/90, pulse 69, temperature (!) 96.8 F (36 C), temperature source Tympanic, resp. rate 18, height 5\' 8"  (1.727 m), weight 261 lb 8 oz (118.6 kg), SpO2 99 %.  LABORATORY DATA: Lab Results  Component Value Date   WBC 5.7 01/27/2021   HGB 12.5 01/27/2021   HCT 40.0 01/27/2021   MCV 81.6 01/27/2021   PLT 282 01/27/2021      Chemistry      Component Value Date/Time   NA 142  05/06/2020 0919   K 4.0 05/06/2020 0919   CL 107 05/06/2020 0919   CO2 24 05/06/2020 0919   BUN 11 05/06/2020 0919   CREATININE 0.80 05/06/2020 0919   CREATININE 0.78 03/24/2012 1026      Component Value Date/Time   CALCIUM 9.8 05/06/2020 0919   ALKPHOS 76 05/06/2020 0919   AST 21 05/06/2020 0919   ALT 25 05/06/2020 0919   BILITOT 0.5 05/06/2020 0919       RADIOGRAPHIC STUDIES: No results found.  ASSESSMENT AND PLAN: This is a very pleasant 50 years old African-American female with history of iron deficiency anemia secondary to menorrhagia.  She received iron infusion with Venofer completed in March 2021.  The patient is currently on oral iron tablets with Integra plus and she is tolerating it fairly well.  She also underwent endometrial curettage with IUD placement. The patient is feeling fine today with no concerning complaints. Repeat CBC showed normal hemoglobin and hematocrit.  Iron study and ferritin are still pending.  If the pending iron studies showed severe iron deficiency, I will arrange for the patient to receive iron infusion. I recommended for her to continue on the oral iron tablet for now. I will see her back for follow-up visit in 6 months for evaluation and repeat CBC, iron study and ferritin. She was advised to call immediately if she has any other concerning symptoms in the interval. The patient voices understanding of current disease status and treatment options and is in agreement with the current care plan. All questions were answered. The patient knows to call the clinic with any problems, questions or concerns. We can certainly see the patient much sooner if necessary.   Disclaimer: This note was dictated with voice recognition software. Similar sounding words can inadvertently be transcribed and may not be corrected upon review.

## 2021-01-27 NOTE — Patient Instructions (Signed)
Steps to Quit Smoking Smoking tobacco is the leading cause of preventable death. It can affect almost every organ in the body. Smoking puts you and people around you at risk for many serious, long-lasting (chronic) diseases. Quitting smoking can be hard, but it is one of the best things that you can do for your health. It is never too late to quit. How do I get ready to quit? When you decide to quit smoking, make a plan to help you succeed. Before you quit:  Pick a date to quit. Set a date within the next 2 weeks to give you time to prepare.  Write down the reasons why you are quitting. Keep this list in places where you will see it often.  Tell your family, friends, and co-workers that you are quitting. Their support is important.  Talk with your doctor about the choices that may help you quit.  Find out if your health insurance will pay for these treatments.  Know the people, places, things, and activities that make you want to smoke (triggers). Avoid them. What first steps can I take to quit smoking?  Throw away all cigarettes at home, at work, and in your car.  Throw away the things that you use when you smoke, such as ashtrays and lighters.  Clean your car. Make sure to empty the ashtray.  Clean your home, including curtains and carpets. What can I do to help me quit smoking? Talk with your doctor about taking medicines and seeing a counselor at the same time. You are more likely to succeed when you do both.  If you are pregnant or breastfeeding, talk with your doctor about counseling or other ways to quit smoking. Do not take medicine to help you quit smoking unless your doctor tells you to do so. To quit smoking: Quit right away  Quit smoking totally, instead of slowly cutting back on how much you smoke over a period of time.  Go to counseling. You are more likely to quit if you go to counseling sessions regularly. Take medicine You may take medicines to help you quit. Some  medicines need a prescription, and some you can buy over-the-counter. Some medicines may contain a drug called nicotine to replace the nicotine in cigarettes. Medicines may:  Help you to stop having the desire to smoke (cravings).  Help to stop the problems that come when you stop smoking (withdrawal symptoms). Your doctor may ask you to use:  Nicotine patches, gum, or lozenges.  Nicotine inhalers or sprays.  Non-nicotine medicine that is taken by mouth. Find resources Find resources and other ways to help you quit smoking and remain smoke-free after you quit. These resources are most helpful when you use them often. They include:  Online chats with a counselor.  Phone quitlines.  Printed self-help materials.  Support groups or group counseling.  Text messaging programs.  Mobile phone apps. Use apps on your mobile phone or tablet that can help you stick to your quit plan. There are many free apps for mobile phones and tablets as well as websites. Examples include Quit Guide from the CDC and smokefree.gov   What things can I do to make it easier to quit?  Talk to your family and friends. Ask them to support and encourage you.  Call a phone quitline (1-800-QUIT-NOW), reach out to support groups, or work with a counselor.  Ask people who smoke to not smoke around you.  Avoid places that make you want to smoke,   such as: ? Bars. ? Parties. ? Smoke-break areas at work.  Spend time with people who do not smoke.  Lower the stress in your life. Stress can make you want to smoke. Try these things to help your stress: ? Getting regular exercise. ? Doing deep-breathing exercises. ? Doing yoga. ? Meditating. ? Doing a body scan. To do this, close your eyes, focus on one area of your body at a time from head to toe. Notice which parts of your body are tense. Try to relax the muscles in those areas.   How will I feel when I quit smoking? Day 1 to 3 weeks Within the first 24 hours,  you may start to have some problems that come from quitting tobacco. These problems are very bad 2-3 days after you quit, but they do not often last for more than 2-3 weeks. You may get these symptoms:  Mood swings.  Feeling restless, nervous, angry, or annoyed.  Trouble concentrating.  Dizziness.  Strong desire for high-sugar foods and nicotine.  Weight gain.  Trouble pooping (constipation).  Feeling like you may vomit (nausea).  Coughing or a sore throat.  Changes in how the medicines that you take for other issues work in your body.  Depression.  Trouble sleeping (insomnia). Week 3 and afterward After the first 2-3 weeks of quitting, you may start to notice more positive results, such as:  Better sense of smell and taste.  Less coughing and sore throat.  Slower heart rate.  Lower blood pressure.  Clearer skin.  Better breathing.  Fewer sick days. Quitting smoking can be hard. Do not give up if you fail the first time. Some people need to try a few times before they succeed. Do your best to stick to your quit plan, and talk with your doctor if you have any questions or concerns. Summary  Smoking tobacco is the leading cause of preventable death. Quitting smoking can be hard, but it is one of the best things that you can do for your health.  When you decide to quit smoking, make a plan to help you succeed.  Quit smoking right away, not slowly over a period of time.  When you start quitting, seek help from your doctor, family, or friends. This information is not intended to replace advice given to you by your health care provider. Make sure you discuss any questions you have with your health care provider. Document Revised: 06/30/2019 Document Reviewed: 12/24/2018 Elsevier Patient Education  2021 Elsevier Inc.  

## 2021-01-28 ENCOUNTER — Telehealth: Payer: Self-pay | Admitting: Internal Medicine

## 2021-01-28 NOTE — Telephone Encounter (Signed)
Scheduled per los. Called and left msg. Mailed printout  °

## 2021-02-14 ENCOUNTER — Other Ambulatory Visit: Payer: Self-pay | Admitting: Physician Assistant

## 2021-02-14 DIAGNOSIS — D500 Iron deficiency anemia secondary to blood loss (chronic): Secondary | ICD-10-CM

## 2021-07-02 ENCOUNTER — Other Ambulatory Visit: Payer: Self-pay | Admitting: Physician Assistant

## 2021-07-02 DIAGNOSIS — D500 Iron deficiency anemia secondary to blood loss (chronic): Secondary | ICD-10-CM

## 2021-07-14 ENCOUNTER — Encounter: Payer: Self-pay | Admitting: Hematology

## 2021-07-21 ENCOUNTER — Inpatient Hospital Stay: Payer: BC Managed Care – PPO | Attending: Internal Medicine

## 2021-07-21 ENCOUNTER — Encounter: Payer: Self-pay | Admitting: Hematology

## 2021-07-21 ENCOUNTER — Inpatient Hospital Stay (HOSPITAL_BASED_OUTPATIENT_CLINIC_OR_DEPARTMENT_OTHER): Payer: BC Managed Care – PPO | Admitting: Internal Medicine

## 2021-07-21 ENCOUNTER — Other Ambulatory Visit: Payer: Self-pay

## 2021-07-21 VITALS — BP 136/92 | HR 95 | Temp 97.4°F | Resp 20 | Ht 68.0 in | Wt 259.6 lb

## 2021-07-21 DIAGNOSIS — D500 Iron deficiency anemia secondary to blood loss (chronic): Secondary | ICD-10-CM | POA: Insufficient documentation

## 2021-07-21 DIAGNOSIS — N920 Excessive and frequent menstruation with regular cycle: Secondary | ICD-10-CM | POA: Insufficient documentation

## 2021-07-21 LAB — IRON AND TIBC
Iron: 64 ug/dL (ref 41–142)
Saturation Ratios: 23 % (ref 21–57)
TIBC: 279 ug/dL (ref 236–444)
UIBC: 215 ug/dL (ref 120–384)

## 2021-07-21 LAB — CBC WITH DIFFERENTIAL (CANCER CENTER ONLY)
Abs Immature Granulocytes: 0.01 10*3/uL (ref 0.00–0.07)
Basophils Absolute: 0 10*3/uL (ref 0.0–0.1)
Basophils Relative: 0 %
Eosinophils Absolute: 0.1 10*3/uL (ref 0.0–0.5)
Eosinophils Relative: 1 %
HCT: 40.2 % (ref 36.0–46.0)
Hemoglobin: 12.9 g/dL (ref 12.0–15.0)
Immature Granulocytes: 0 %
Lymphocytes Relative: 41 %
Lymphs Abs: 2.2 10*3/uL (ref 0.7–4.0)
MCH: 26.3 pg (ref 26.0–34.0)
MCHC: 32.1 g/dL (ref 30.0–36.0)
MCV: 82 fL (ref 80.0–100.0)
Monocytes Absolute: 0.4 10*3/uL (ref 0.1–1.0)
Monocytes Relative: 7 %
Neutro Abs: 2.7 10*3/uL (ref 1.7–7.7)
Neutrophils Relative %: 51 %
Platelet Count: 280 10*3/uL (ref 150–400)
RBC: 4.9 MIL/uL (ref 3.87–5.11)
RDW: 13.9 % (ref 11.5–15.5)
WBC Count: 5.4 10*3/uL (ref 4.0–10.5)
nRBC: 0 % (ref 0.0–0.2)

## 2021-07-21 LAB — FERRITIN: Ferritin: 299 ng/mL (ref 11–307)

## 2021-07-21 NOTE — Progress Notes (Signed)
[facility] [phone]   Fax:([phone]  OFFICE PROGRESS NOTE  Patient, No Pcp Per (Inactive) No address on file  DIAGNOSIS: Iron deficiency anemia secondary to menorrhagia    PRIOR THERAPY:  Iron infusion with Venofer.  Last received on 12/22/2019     CURRENT THERAPY:  Integra +1 capsule p.o. daily  INTERVAL HISTORY: Lauren Walls 50 y.o. female returns to the clinic today for follow-up visit.  The patient is feeling fine today with no concerning complaints.  She denied having any significant fatigue or weakness.  She denied having any chest pain, shortness of breath, cough or hemoptysis.  She has no nausea, vomiting, diarrhea or constipation.  She has no headache or visual changes.  She has no recent weight loss or night sweats.  She is currently on treatment with Integra +1 capsule p.o. daily and tolerating it fairly well.  The patient is here today for evaluation and repeat CBC, iron study and ferritin.  MEDICAL HISTORY: Past Medical History:  Diagnosis Date   Abnormal Pap smear 05/2011   colpo   Blood transfusion 2008   Depression    H/O candidiasis    BV    Hypertension     ALLERGIES:  is allergic to lisinopril.  MEDICATIONS:  Current Outpatient Medications  Medication Sig Dispense Refill   bisoprolol-hydrochlorothiazide (ZIAC) 5-6.25 MG tablet bisoprolol 5 mg-hydrochlorothiazide 6.25 mg tablet     cetirizine (ZYRTEC) 10 MG tablet Take 10 mg by mouth as needed for allergies.     desvenlafaxine (PRISTIQ) 50 MG 24 hr tablet Take 50 mg by mouth daily.     FeFum-FePoly-FA-B Cmp-C-Biot (INTEGRA PLUS) CAPS TAKE 1 CAPSULE BY MOUTH EVERY MORNING 30 capsule 2   Green Tea, Camellia sinensis, (GREEN TEA EXTRACT PO) Take 1 tablet by mouth daily. With Red yeast rice     levonorgestrel (KYLEENA) 19.5 MG IUD Kyleena 17.5 mcg/24 hrs (67yrs) 19.5mg  intrauterine device  Take 1 device by intrauterine route.     spironolactone (ALDACTONE) 100 MG  tablet Take 100 mg by mouth at bedtime.     triamcinolone (NASACORT) 55 MCG/ACT AERO nasal inhaler Place 2 sprays into the nose as needed. 2 sprays each nostril PRN     vitamin E 1000 UNIT capsule Take 1,000 Units by mouth daily.     No current facility-administered medications for this visit.    SURGICAL HISTORY:  Past Surgical History:  Procedure Laterality Date   CESAREAN SECTION  04/03/2012   Procedure: CESAREAN SECTION;  Surgeon: Purcell Nails, MD;  Location: [facility];  Service: Gynecology;  Laterality: N/A;  Primary Cesarean Section with birth of baby boy @ 1542 apgars 8/9   DILATION AND CURETTAGE OF UTERUS  2008   TONSILLECTOMY     WISDOM TOOTH EXTRACTION     x 4    REVIEW OF SYSTEMS:  A comprehensive review of systems was negative.   PHYSICAL EXAMINATION: General appearance: alert, cooperative, and no distress Head: Normocephalic, without obvious abnormality, atraumatic Neck: no adenopathy, no JVD, supple, symmetrical, trachea midline, and thyroid not enlarged, symmetric, no tenderness/mass/nodules Lymph nodes: Cervical, supraclavicular, and axillary nodes normal. Resp: clear to auscultation bilaterally Back: symmetric, no curvature. ROM normal. No CVA tenderness. Cardio: regular rate and rhythm, S1, S2 normal, no murmur, click, rub or gallop GI: soft, non-tender; bowel sounds normal; no masses,  no organomegaly Extremities: extremities normal, atraumatic, no cyanosis or edema  ECOG PERFORMANCE STATUS: 0 - Asymptomatic  Blood pressure Marland Kitchen)  136/92, pulse 95, temperature (!) 97.4 F (36.3 C), temperature source Tympanic, resp. rate 20, height 5\' 8"  (1.727 m), weight 259 lb 9.6 oz (117.8 kg), SpO2 100 %.  LABORATORY DATA: Lab Results  Component Value Date   WBC 5.7 01/27/2021   HGB 12.5 01/27/2021   HCT 40.0 01/27/2021   MCV 81.6 01/27/2021   PLT 282 01/27/2021      Chemistry      Component Value Date/Time   NA 142 05/06/2020 0919   K 4.0 05/06/2020 0919   CL 107  05/06/2020 0919   CO2 24 05/06/2020 0919   BUN 11 05/06/2020 0919   CREATININE 0.80 05/06/2020 0919   CREATININE 0.78 03/24/2012 1026      Component Value Date/Time   CALCIUM 9.8 05/06/2020 0919   ALKPHOS 76 05/06/2020 0919   AST 21 05/06/2020 0919   ALT 25 05/06/2020 0919   BILITOT 0.5 05/06/2020 0919       RADIOGRAPHIC STUDIES: No results found.  ASSESSMENT AND PLAN: This is a very pleasant 50 years old African-American female with history of iron deficiency anemia secondary to menorrhagia.  She received iron infusion with Venofer completed in March 2021.  The patient is currently on oral iron tablets with Integra plus and she is tolerating it fairly well.  She also underwent endometrial curettage with IUD placement. The patient is feeling very well today with no concerning complaints. Repeat CBC showed normal hemoglobin and hematocrit.  Iron study and ferritin are still pending. I recommended for the patient to continue on the oral iron tablet for now.  I will see her back for follow-up visit in 6 months for evaluation with repeat CBC, iron study and ferritin. I may consider her for iron infusion in the interval if the pending iron studies showed significant iron deficiency. She was advised to call immediately if she has any other concerning symptoms in the interval. The patient voices understanding of current disease status and treatment options and is in agreement with the current care plan. All questions were answered. The patient knows to call the clinic with any problems, questions or concerns. We can certainly see the patient much sooner if necessary.   Disclaimer: This note was dictated with voice recognition software. Similar sounding words can inadvertently be transcribed and may not be corrected upon review.

## 2021-07-22 ENCOUNTER — Telehealth: Payer: Self-pay | Admitting: Internal Medicine

## 2021-07-22 NOTE — Telephone Encounter (Signed)
Scheduled appt per 10/3 los - called pt . No answer left message for patient with appt date and time

## 2021-07-29 ENCOUNTER — Other Ambulatory Visit: Payer: 59

## 2021-07-29 ENCOUNTER — Ambulatory Visit: Payer: 59 | Admitting: Internal Medicine

## 2021-12-16 ENCOUNTER — Other Ambulatory Visit: Payer: Self-pay | Admitting: Physician Assistant

## 2021-12-16 DIAGNOSIS — D500 Iron deficiency anemia secondary to blood loss (chronic): Secondary | ICD-10-CM

## 2022-01-19 ENCOUNTER — Inpatient Hospital Stay: Payer: BC Managed Care – PPO

## 2022-01-19 ENCOUNTER — Inpatient Hospital Stay: Payer: BC Managed Care – PPO | Admitting: Internal Medicine

## 2022-10-14 ENCOUNTER — Encounter: Payer: Self-pay | Admitting: Hematology

## 2024-01-12 ENCOUNTER — Encounter: Payer: Self-pay | Admitting: Internal Medicine

## 2024-02-28 ENCOUNTER — Encounter (HOSPITAL_COMMUNITY): Payer: Self-pay

## 2024-03-01 ENCOUNTER — Encounter: Payer: Self-pay | Admitting: Hematology

## 2024-03-06 ENCOUNTER — Telehealth: Payer: Self-pay

## 2024-03-06 ENCOUNTER — Ambulatory Visit

## 2024-03-06 VITALS — Ht 68.0 in | Wt 237.0 lb

## 2024-03-06 DIAGNOSIS — Z1211 Encounter for screening for malignant neoplasm of colon: Secondary | ICD-10-CM

## 2024-03-06 MED ORDER — ONDANSETRON HCL 4 MG PO TABS
4.0000 mg | ORAL_TABLET | ORAL | 0 refills | Status: AC
Start: 2024-03-06 — End: ?

## 2024-03-06 MED ORDER — NA SULFATE-K SULFATE-MG SULF 17.5-3.13-1.6 GM/177ML PO SOLN: 1.0000 | Freq: Once | ORAL | 0 refills | Status: AC

## 2024-03-06 NOTE — Telephone Encounter (Signed)
 Called for pre visit and there was no answer.  Will call back in 5 min

## 2024-03-06 NOTE — Progress Notes (Signed)

## 2024-03-06 NOTE — Telephone Encounter (Signed)
Completed Pre visit.

## 2024-03-07 ENCOUNTER — Encounter: Payer: Self-pay | Admitting: Internal Medicine

## 2024-03-20 ENCOUNTER — Encounter: Payer: Self-pay | Admitting: Internal Medicine

## 2024-03-20 ENCOUNTER — Ambulatory Visit (AMBULATORY_SURGERY_CENTER): Admitting: Internal Medicine

## 2024-03-20 VITALS — BP 122/67 | HR 76 | Temp 97.2°F | Resp 17 | Ht 68.0 in | Wt 237.0 lb

## 2024-03-20 DIAGNOSIS — Z1211 Encounter for screening for malignant neoplasm of colon: Secondary | ICD-10-CM

## 2024-03-20 DIAGNOSIS — K648 Other hemorrhoids: Secondary | ICD-10-CM | POA: Diagnosis not present

## 2024-03-20 DIAGNOSIS — D123 Benign neoplasm of transverse colon: Secondary | ICD-10-CM

## 2024-03-20 DIAGNOSIS — K5730 Diverticulosis of large intestine without perforation or abscess without bleeding: Secondary | ICD-10-CM | POA: Diagnosis not present

## 2024-03-20 MED ORDER — SODIUM CHLORIDE 0.9 % IV SOLN: 500.0000 mL | INTRAVENOUS | Status: DC

## 2024-03-20 NOTE — Progress Notes (Signed)
 GASTROENTEROLOGY PROCEDURE H&P NOTE   Primary Care Physician: Patient, No Pcp Per    Reason for Procedure:   Colon cancer screening  Plan:    Colonoscopy  Patient is appropriate for endoscopic procedure(s) in the ambulatory ([facility]) setting.  The nature of the procedure, as well as the risks, benefits, and alternatives were carefully and thoroughly reviewed with the patient. Ample time for discussion and questions allowed. The patient understood, was satisfied, and agreed to proceed.     HPI: Lauren Walls is a 53 y.o. female who presents for colonoscopy for colon cancer screening. Denies blood in stools, changes in bowel habits, or unintentional weight loss. Denies family history of colon cancer.  Past Medical History:  Diagnosis Date   Abnormal Pap smear 05/20/2011   colpo   Allergy    Anemia    Arthritis    Blood transfusion 10/19/2006   Depression    H/O candidiasis    BV    Hypertension     Past Surgical History:  Procedure Laterality Date   CESAREAN SECTION  04/03/2012   Procedure: CESAREAN SECTION;  Surgeon: Madelene Schanz, MD;  Location: [facility];  Service: Gynecology;  Laterality: N/A;  Primary Cesarean Section with birth of baby boy @ 1542 apgars 8/9   DILATION AND CURETTAGE OF UTERUS  2008   TONSILLECTOMY     WISDOM TOOTH EXTRACTION     x 4    Prior to Admission medications   Medication Sig Start Date End Date Taking? Authorizing Provider  BERBERINE CHLORIDE PO Take by mouth.    [provider]  celecoxib (CELEBREX) 200 MG capsule Take 200 mg by mouth daily as needed. 11/02/23   [provider]  cetirizine (ZYRTEC) 10 MG tablet Take 10 mg by mouth as needed for allergies.    [provider]  docusate sodium  (COLACE) 100 MG capsule  06/20/23   [provider]  Emollient (EUCERIN ROUGHNESS RELIEF) CREA as directed Externally 02/23/24   [provider]  Fluocinolone Acetonide (DERMOTIC) 0.01 % OIL 2 to 3 drops  into affected ear Otic once a day prn; Duration: 7 days 04/11/21   [provider]  furosemide (LASIX) 20 MG tablet Take 20 mg by mouth 2 (two) times daily as needed.    [provider]  Marrie Sizer Tea, Camellia sinensis, (GREEN TEA PO) Take by mouth.    [provider]  hydrocortisone 2.5 % cream 1 Application. 05/24/23   [provider]  levonorgestrel  (KYLEENA ) 19.5 MG IUD Kyleena  17.5 mcg/24 hrs (81yrs) 19.5mg  intrauterine device  Take 1 device by intrauterine route.    [provider]  LOSARTAN POTASSIUM PO  12/18/22   [provider]  ondansetron  (ZOFRAN ) 4 MG tablet Take 1 tablet (4 mg total) by mouth as directed. Take one Zofran  4 mg tablet 30-60 minutes before each colonoscopy prep dose 03/06/24   Ralpheal Zappone C, MD  Probiotic Product (PROBIOTIC DAILY PO)  12/18/22   [provider]  spironolactone (ALDACTONE) 100 MG tablet Take 100 mg by mouth at bedtime. 01/22/21   [provider]  tirzepatide (ZEPBOUND) 7.5 MG/0.5ML Pen Inject 7.5 mg into the skin once a week.    [provider]  tretinoin (RETIN-A) 0.025 % cream 1 application in the evening to face Externally (FOR ACNE) Once a day; Duration: 30 days 09/05/20   [provider]  triamcinolone (NASACORT) 55 MCG/ACT AERO nasal inhaler Place 2 sprays into the nose as needed. 2  sprays each nostril PRN    [provider]  Vitamin D-Vitamin K (D3 + K2 PO)     [provider]  vitamin E 1000 UNIT capsule Take 1,000 Units by mouth daily.    [provider]    Current Outpatient Medications  Medication Sig Dispense Refill   BERBERINE CHLORIDE PO Take by mouth.     cetirizine (ZYRTEC) 10 MG tablet Take 10 mg by mouth as needed for allergies.     docusate sodium  (COLACE) 100 MG capsule      Emollient (EUCERIN ROUGHNESS RELIEF) CREA as directed Externally     furosemide (LASIX) 20 MG tablet Take 20 mg by mouth 2 (two) times daily as needed.      Green Tea, Camellia sinensis, (GREEN TEA PO) Take by mouth.     levonorgestrel  (KYLEENA ) 19.5 MG IUD Kyleena  17.5 mcg/24 hrs (30yrs) 19.5mg  intrauterine device  Take 1 device by intrauterine route.     LOSARTAN POTASSIUM PO      Na Sulfate-K Sulfate-Mg Sulfate concentrate (SUPREP) 17.5-3.13-1.6 GM/177ML SOLN as directed.     ondansetron  (ZOFRAN ) 4 MG tablet Take 1 tablet (4 mg total) by mouth as directed. Take one Zofran  4 mg tablet 30-60 minutes before each colonoscopy prep dose 4 tablet 0   Probiotic Product (PROBIOTIC DAILY PO)      spironolactone (ALDACTONE) 100 MG tablet Take 100 mg by mouth at bedtime.     tretinoin (RETIN-A) 0.025 % cream 1 application in the evening to face Externally (FOR ACNE) Once a day; Duration: 30 days     Vitamin D-Vitamin K (D3 + K2 PO)      celecoxib (CELEBREX) 200 MG capsule Take 200 mg by mouth daily as needed.     Fluocinolone Acetonide (DERMOTIC) 0.01 % OIL 2 to 3 drops into affected ear Otic once a day prn; Duration: 7 days     hydrocortisone 2.5 % cream 1 Application.     tirzepatide (ZEPBOUND) 7.5 MG/0.5ML Pen Inject 7.5 mg into the skin once a week.     triamcinolone (NASACORT) 55 MCG/ACT AERO nasal inhaler Place 2 sprays into the nose as needed. 2 sprays each nostril PRN     vitamin E 1000 UNIT capsule Take 1,000 Units by mouth daily.     Current Facility-Administered Medications  Medication Dose Route Frequency Provider Last Rate Last Admin   0.9 %  sodium chloride  infusion  500 mL Intravenous Continuous Daina Drum, MD        Allergies as of 03/20/2024 - Review Complete 03/20/2024  Allergen Reaction Noted   Lisinopril Swelling 09/08/2019    Family History  Problem Relation Age of Onset   Hypertension Mother    Cancer Mother    Prostate cancer Father    Heart disease Father    Hypertension Father    Diabetes Father    Cancer Father        prostate   Hypertension Brother    Colon polyps Maternal Aunt    Cancer Maternal Aunt         lung ca   Hypertension Maternal Aunt    COPD Maternal Uncle        emphysema   Hypertension Maternal Uncle    Hypertension Paternal Aunt    Hypertension Paternal Uncle    Hypertension Maternal Grandmother    Cancer Maternal Grandfather        lung   Hypertension Maternal Grandfather    Heart disease Paternal Grandmother  CHF   Hypertension Paternal Grandmother    Hypertension Paternal Grandfather    Colon cancer Neg Hx    Esophageal cancer Neg Hx    Rectal cancer Neg Hx    Stomach cancer Neg Hx     Social History   Socioeconomic History   Marital status: Married    Spouse name: Not on file   Number of children: Not on file   Years of education: Not on file   Highest education level: Not on file  Occupational History   Not on file  Tobacco Use   Smoking status: Every Day    Current packs/day: 1.00    Types: Cigarettes   Smokeless tobacco: Never  Vaping Use   Vaping status: Never Used  Substance and Sexual Activity   Alcohol use: No   Drug use: No   Sexual activity: Never  Other Topics Concern   Not on file  Social History Narrative   Not on file   Social Drivers of Health   Financial Resource Strain: Not on file  Food Insecurity: Not on file  Transportation Needs: Not on file  Physical Activity: Not on file  Stress: Not on file  Social Connections: Not on file  Intimate Partner Violence: Not on file    Physical Exam: Vital signs in last 24 hours: BP 130/85   Pulse 83   Temp (!) 97.2 F (36.2 C)   Ht 5\' 8"  (1.727 m)   Wt 237 lb (107.5 kg)   SpO2 98%   BMI 36.04 kg/m  GEN: NAD EYE: Sclerae anicteric ENT: MMM CV: Non-tachycardic Pulm: No increased work of breathing GI: Soft, NT/ND NEURO:  Alert & Oriented   Regino Caprio, MD [facility]  03/20/2024 10:28 AM

## 2024-03-20 NOTE — Progress Notes (Signed)
 Called to room to assist during endoscopic procedure.  Patient ID and intended procedure confirmed with present staff. Received instructions for my participation in the procedure from the performing physician.

## 2024-03-20 NOTE — Op Note (Signed)
 [facility] Patient Name: Lauren Walls Procedure Date: 03/20/2024 10:30 AM MRN: [id number] Endoscopist: Freada Jacobs [place] , , [id number] Age: 53 Referring MD:  Date of Birth: 09-12-1971 Gender: Female Account #: [id number] Procedure:                Colonoscopy Indications:              Screening for colorectal malignant neoplasm, This                            is the patient's first colonoscopy Medicines:                Monitored Anesthesia Care Procedure:                Pre-Anesthesia Assessment:                           - Prior to the procedure, a History and Physical                            was performed, and patient medications and                            allergies were reviewed. The patient's tolerance of                            previous anesthesia was also reviewed. The risks                            and benefits of the procedure and the sedation                            options and risks were discussed with the patient.                            All questions were answered, and informed consent                            was obtained. Prior Anticoagulants: The patient has                            taken no anticoagulant or antiplatelet agents. ASA                            Grade Assessment: III - A patient with severe                            systemic disease. After reviewing the risks and                            benefits, the patient was deemed in satisfactory                            condition to undergo the procedure.  After obtaining informed consent, the colonoscope                            was passed under direct vision. Throughout the                            procedure, the patient's blood pressure, pulse, and                            oxygen saturations were monitored continuously. The                            Olympus Scope SN: [id number] was introduced through                            the anus and  advanced to the the terminal ileum.                            The colonoscopy was performed without difficulty.                            The patient tolerated the procedure well. The                            quality of the bowel preparation was excellent. The                            ileocecal valve, the appendiceal orifice and the                            rectum were photographed. Scope In: 10:50:49 AM Scope Out: 11:05:18 AM Scope Withdrawal Time: 0 hours 12 minutes 5 seconds  Total Procedure Duration: 0 hours 14 minutes 29 seconds  Findings:                 The terminal ileum appeared normal.                           A 4 mm polyp was found in the transverse colon. The                            polyp was sessile. The polyp was removed with a                            cold snare. Resection and retrieval were complete.                           Multiple diverticula were found in the sigmoid                            colon.                           Non-bleeding internal hemorrhoids were found during  retroflexion. Complications:            No immediate complications. Estimated Blood Loss:     Estimated blood loss was minimal. Impression:               - The examined portion of the ileum was normal.                           - One 4 mm polyp in the transverse colon, removed                            with a cold snare. Resected and retrieved.                           - Diverticulosis in the sigmoid colon.                           - Non-bleeding internal hemorrhoids. Recommendation:           - Discharge patient to home (with escort).                           - Await pathology results.                           - The findings and recommendations were discussed                            with the patient. Dr Pedro Bourgeois "Nash" [place],  03/20/2024 11:08:29 AM

## 2024-03-20 NOTE — Progress Notes (Signed)
 Sedate, gd SR, tolerated procedure well, VSS, report to RN

## 2024-03-20 NOTE — Patient Instructions (Signed)

## 2024-03-21 ENCOUNTER — Telehealth: Payer: Self-pay

## 2024-03-21 NOTE — Telephone Encounter (Signed)
  Follow up Call-     03/20/2024   10:14 AM  Call back number  Post procedure Call Back phone  # [phone]  Permission to leave phone message Yes     Patient questions:  Do you have a fever, pain , or abdominal swelling? No. Pain Score  0 *  Have you tolerated food without any problems? Yes.    Have you been able to return to your normal activities? Yes.    Do you have any questions about your discharge instructions: Diet   No. Medications  No. Follow up visit  No.  Do you have questions or concerns about your Care? No.  Actions: * If pain score is 4 or above: No action needed, pain <4.

## 2024-03-23 ENCOUNTER — Ambulatory Visit: Payer: Self-pay | Admitting: Internal Medicine

## 2024-03-23 LAB — SURGICAL PATHOLOGY
